# Patient Record
Sex: Female | Born: 1990 | Race: White | Hispanic: Yes | Marital: Single | Smoking: Current every day smoker
Health system: Southern US, Community
[De-identification: ages and names within clinical notes are randomized; demographics above are authoritative.]

## PROBLEM LIST (undated history)

## (undated) DIAGNOSIS — J189 Pneumonia, unspecified organism: Secondary | ICD-10-CM

## (undated) DIAGNOSIS — K219 Gastro-esophageal reflux disease without esophagitis: Secondary | ICD-10-CM

## (undated) DIAGNOSIS — F419 Anxiety disorder, unspecified: Secondary | ICD-10-CM

## (undated) DIAGNOSIS — R569 Unspecified convulsions: Secondary | ICD-10-CM

## (undated) HISTORY — PX: NO PAST SURGERIES: SHX2092

## (undated) HISTORY — DX: Unspecified convulsions: R56.9

---
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
## 2012-03-31 DIAGNOSIS — K219 Gastro-esophageal reflux disease without esophagitis: Secondary | ICD-10-CM

## 2012-03-31 HISTORY — DX: Gastro-esophageal reflux disease without esophagitis: K21.9

## 2012-08-04 ENCOUNTER — Ambulatory Visit: Payer: Self-pay | Admitting: Family Medicine

## 2012-08-04 LAB — URINALYSIS, COMPLETE
Bacteria: NONE SEEN
Bilirubin,UR: NEGATIVE
Blood: NEGATIVE
Glucose,UR: NEGATIVE mg/dL (ref 0–75)
Leukocyte Esterase: NEGATIVE
Nitrite: NEGATIVE
Ph: 7 (ref 4.5–8.0)
Protein: 30
RBC,UR: 1 /HPF (ref 0–5)
Specific Gravity: 1.017 (ref 1.003–1.030)
Squamous Epithelial: 1
WBC UR: 3 /HPF (ref 0–5)

## 2012-08-04 LAB — COMPREHENSIVE METABOLIC PANEL
Albumin: 4 g/dL (ref 3.8–5.6)
Alkaline Phosphatase: 81 U/L — ABNORMAL LOW (ref 82–169)
Anion Gap: 10 (ref 7–16)
BUN: 7 mg/dL (ref 7–18)
Bilirubin,Total: 1.7 mg/dL — ABNORMAL HIGH (ref 0.2–1.0)
Calcium, Total: 9.2 mg/dL (ref 9.0–10.7)
Chloride: 100 mmol/L (ref 98–107)
Co2: 22 mmol/L (ref 21–32)
Creatinine: 0.61 mg/dL (ref 0.60–1.30)
EGFR (African American): >60
EGFR (Non-African Amer.): >60
Glucose: 93 mg/dL (ref 65–99)
Osmolality: 262 (ref 275–301)
Potassium: 3.5 mmol/L (ref 3.5–5.1)
SGOT(AST): 14 U/L (ref 0–26)
SGPT (ALT): 15 U/L (ref 12–78)
Sodium: 132 mmol/L — ABNORMAL LOW (ref 136–145)
Total Protein: 8.2 g/dL (ref 6.4–8.6)

## 2012-08-04 LAB — CBC WITH DIFFERENTIAL/PLATELET
Basophil #: 0.1 10*3/uL (ref 0.0–0.1)
Basophil %: 0.3 %
Eosinophil #: 0 10*3/uL (ref 0.0–0.7)
Eosinophil %: 0 %
HCT: 40.4 % (ref 35.0–47.0)
HGB: 14 g/dL (ref 12.0–16.0)
Lymphocyte #: 0.9 10*3/uL — ABNORMAL LOW (ref 1.0–3.6)
Lymphocyte %: 3.7 %
MCH: 30 pg (ref 26.0–34.0)
MCHC: 34.6 g/dL (ref 32.0–36.0)
MCV: 87 fL (ref 80–100)
Monocyte #: 1.7 x10 3/mm — ABNORMAL HIGH (ref 0.2–0.9)
Monocyte %: 6.9 %
Neutrophil #: 21.4 10*3/uL — ABNORMAL HIGH (ref 1.4–6.5)
Neutrophil %: 89.1 %
Platelet: 246 10*3/uL (ref 150–440)
RBC: 4.67 10*6/uL (ref 3.80–5.20)
RDW: 12.9 % (ref 11.5–14.5)
WBC: 24.1 10*3/uL — ABNORMAL HIGH (ref 3.6–11.0)

## 2012-08-04 LAB — MONONUCLEOSIS SCREEN: Mono Test: NEGATIVE

## 2012-08-04 LAB — CBC
HCT: 42.5 % (ref 35.0–47.0)
HGB: 15 g/dL (ref 12.0–16.0)
MCH: 30.2 pg (ref 26.0–34.0)
MCHC: 35.3 g/dL (ref 32.0–36.0)
MCV: 86 fL (ref 80–100)
Platelet: 237 10*3/uL (ref 150–440)
RBC: 4.96 10*6/uL (ref 3.80–5.20)
RDW: 12.8 % (ref 11.5–14.5)
WBC: 26.7 10*3/uL — ABNORMAL HIGH (ref 3.6–11.0)

## 2012-08-04 LAB — RAPID INFLUENZA A&B ANTIGENS

## 2012-08-04 LAB — PREGNANCY, URINE: Pregnancy Test, Urine: NEGATIVE m[IU]/mL

## 2012-08-04 LAB — RAPID STREP-A WITH REFLX: Micro Text Report: NEGATIVE

## 2012-08-05 ENCOUNTER — Observation Stay: Payer: Self-pay | Admitting: Specialist

## 2012-08-05 LAB — CBC WITH DIFFERENTIAL/PLATELET
Basophil #: 0.1 10*3/uL (ref 0.0–0.1)
Basophil %: 0.4 %
Eosinophil #: 0 10*3/uL (ref 0.0–0.7)
Eosinophil %: 0 %
HCT: 35.4 % (ref 35.0–47.0)
HGB: 12.7 g/dL (ref 12.0–16.0)
Lymphocyte #: 1.2 10*3/uL (ref 1.0–3.6)
Lymphocyte %: 5.8 %
MCH: 30.7 pg (ref 26.0–34.0)
MCHC: 35.8 g/dL (ref 32.0–36.0)
MCV: 86 fL (ref 80–100)
Monocyte #: 1.8 x10 3/mm — ABNORMAL HIGH (ref 0.2–0.9)
Monocyte %: 8.5 %
Neutrophil #: 17.7 10*3/uL — ABNORMAL HIGH (ref 1.4–6.5)
Neutrophil %: 85.3 %
Platelet: 194 10*3/uL (ref 150–440)
RBC: 4.12 10*6/uL (ref 3.80–5.20)
RDW: 13 % (ref 11.5–14.5)
WBC: 20.7 10*3/uL — ABNORMAL HIGH (ref 3.6–11.0)

## 2012-08-05 LAB — BASIC METABOLIC PANEL
Anion Gap: 9 (ref 7–16)
BUN: 4 mg/dL — ABNORMAL LOW (ref 7–18)
Calcium, Total: 8.2 mg/dL — ABNORMAL LOW (ref 9.0–10.7)
Chloride: 108 mmol/L — ABNORMAL HIGH (ref 98–107)
Co2: 22 mmol/L (ref 21–32)
Creatinine: 0.65 mg/dL (ref 0.60–1.30)
EGFR (African American): >60
EGFR (Non-African Amer.): >60
Glucose: 114 mg/dL — ABNORMAL HIGH (ref 65–99)
Osmolality: 275 (ref 275–301)
Potassium: 3.4 mmol/L — ABNORMAL LOW (ref 3.5–5.1)
Sodium: 139 mmol/L (ref 136–145)

## 2012-08-05 LAB — SEDIMENTATION RATE: Erythrocyte Sed Rate: 14 mm/hr (ref 0–20)

## 2012-08-06 LAB — WBC: WBC: 11 10*3/uL (ref 3.6–11.0)

## 2012-08-07 LAB — BETA STREP CULTURE(ARMC)

## 2012-08-10 LAB — CULTURE, BLOOD (SINGLE)

## 2012-08-12 ENCOUNTER — Emergency Department: Payer: Self-pay | Admitting: Unknown Physician Specialty

## 2012-08-19 ENCOUNTER — Emergency Department: Payer: Self-pay | Admitting: Emergency Medicine

## 2012-08-25 ENCOUNTER — Emergency Department: Payer: Self-pay | Admitting: Internal Medicine

## 2012-12-31 ENCOUNTER — Ambulatory Visit: Payer: Self-pay | Admitting: Family Medicine

## 2012-12-31 LAB — URINALYSIS, COMPLETE

## 2013-01-02 LAB — URINE CULTURE

## 2013-01-18 ENCOUNTER — Ambulatory Visit: Payer: Self-pay | Admitting: Emergency Medicine

## 2013-01-18 LAB — RAPID STREP-A WITH REFLX: Micro Text Report: NEGATIVE

## 2013-01-21 LAB — BETA STREP CULTURE(ARMC)

## 2013-04-26 ENCOUNTER — Ambulatory Visit: Payer: Self-pay | Admitting: Emergency Medicine

## 2013-04-26 LAB — RAPID STREP-A WITH REFLX: Micro Text Report: NEGATIVE

## 2013-04-29 LAB — BETA STREP CULTURE(ARMC)

## 2013-05-12 ENCOUNTER — Ambulatory Visit: Payer: Self-pay

## 2013-12-12 ENCOUNTER — Ambulatory Visit: Payer: Self-pay

## 2013-12-12 ENCOUNTER — Ambulatory Visit: Payer: Self-pay | Admitting: Physician Assistant

## 2013-12-12 LAB — URINALYSIS, COMPLETE
Bacteria: NEGATIVE
Bilirubin,UR: NEGATIVE
Blood: NEGATIVE
Glucose,UR: NEGATIVE
Ketone: NEGATIVE
Leukocyte Esterase: NEGATIVE
Nitrite: NEGATIVE
Ph: 6 (ref 5.0–8.0)
Protein: NEGATIVE
Specific Gravity: 1.025 (ref 1.000–1.030)

## 2013-12-12 LAB — COMPREHENSIVE METABOLIC PANEL
Albumin: 3.9 g/dL (ref 3.4–5.0)
Alkaline Phosphatase: 54 U/L
Anion Gap: 8 (ref 7–16)
BUN: 12 mg/dL (ref 7–18)
Bilirubin,Total: 0.8 mg/dL (ref 0.2–1.0)
Calcium, Total: 9.1 mg/dL (ref 8.5–10.1)
Chloride: 103 mmol/L (ref 98–107)
Co2: 27 mmol/L (ref 21–32)
Creatinine: 0.7 mg/dL (ref 0.60–1.30)
EGFR (African American): >60
EGFR (Non-African Amer.): >60
Glucose: 85 mg/dL (ref 65–99)
Osmolality: 275 (ref 275–301)
Potassium: 4.3 mmol/L (ref 3.5–5.1)
SGOT(AST): 14 U/L — ABNORMAL LOW (ref 15–37)
SGPT (ALT): 24 U/L
Sodium: 138 mmol/L (ref 136–145)
Total Protein: 7.1 g/dL (ref 6.4–8.2)

## 2013-12-12 LAB — CBC WITH DIFFERENTIAL/PLATELET
Basophil #: 0 10*3/uL (ref 0.0–0.1)
Basophil %: 0.4 %
Eosinophil #: 0.1 10*3/uL (ref 0.0–0.7)
Eosinophil %: 1.5 %
HCT: 45.1 % (ref 35.0–47.0)
HGB: 15.1 g/dL (ref 12.0–16.0)
Lymphocyte #: 1.7 10*3/uL (ref 1.0–3.6)
Lymphocyte %: 19.6 %
MCH: 30.6 pg (ref 26.0–34.0)
MCHC: 33.4 g/dL (ref 32.0–36.0)
MCV: 92 fL (ref 80–100)
Monocyte #: 0.4 x10 3/mm (ref 0.2–0.9)
Monocyte %: 5 %
Neutrophil #: 6.4 10*3/uL (ref 1.4–6.5)
Neutrophil %: 73.5 %
Platelet: 257 10*3/uL (ref 150–440)
RBC: 4.91 10*6/uL (ref 3.80–5.20)
RDW: 13.3 % (ref 11.5–14.5)
WBC: 8.7 10*3/uL (ref 3.6–11.0)

## 2013-12-12 LAB — AMYLASE: Amylase: 26 U/L (ref 25–115)

## 2013-12-12 LAB — PREGNANCY, URINE: Pregnancy Test, Urine: NEGATIVE m[IU]/mL

## 2013-12-12 LAB — LIPASE, BLOOD: Lipase: 99 U/L (ref 73–393)

## 2013-12-14 LAB — URINE CULTURE

## 2014-07-21 NOTE — H&P (Signed)
PATIENT NAME:  Robin Hayden, ANSELMO MR#:  [id number] DATE OF BIRTH:  [birth year]  DATE OF ADMISSION:  08/05/2012  PRIMARY CARE PHYSICIAN:  Nonlocal.   REFERRING PHYSICIAN:  Dr. Owens Shark.   CHIEF COMPLAINT:  Sore throat. There is generalized body aches and fever for 2 days.   HISTORY OF PRESENT ILLNESS:  The patient is a 24 year old Caucasian female with a past medical history of gastritis is presenting to the ER with a chief complaint of two day history of sore throat associated with difficulty in swallowing, fever and body aches.  She is having chills associated with fever.  No sick contacts.  Denies any coughing.  As her situation is getting worse she came into the ER.  In the ER rapid strep test was negative.  Mono spot test was negative, but the patient is febrile, tachycardic and hypotensive with a 26.7 white count.  The patient's lactic acid is also elevated at 1.9.  Influenza test was negative.  The patient was given IV fluids, IV Rocephin and vancomycin and hospitalist team is called to admit the patient as she is septic and sick-looking.  The patient is reporting that she is not truly allergic to PENICILLIN, THOUGH PENICILLIN is mentioned in her allergy list.  The patient's sister who is at bedside and I have discussed with patient's mother over phone during my examination.  The patient denies any dizziness, chest pain, shortness of breath, coughing or nausea or vomiting.  No similar complaints in the past.   PAST MEDICAL HISTORY:  Gastritis.   PAST SURGICAL HISTORY:  None.     ALLERGIES:  According to the list here it says the patient is allergic to [place], but she is reporting that she is not truly allergic to PENICILLIN.   PSYCHOSOCIAL HISTORY:  Lives at home with mom.  Smokes 1 pack in a week.  Occasional intake of beer in 2 to 3 weeks.  Denies any street drugs.   FAMILY HISTORY:  Mother had history of hypertension.  Grandmother has history of diabetes mellitus.   HOME  MEDICATIONS:  Tylenol 500 mg once a day.   REVIEW OF SYSTEMS:  CONSTITUTIONAL:  Complaining of fever, fatigue, weakness and throat pain.  Denies any weight loss or weight gain.  EYES:  No blurry vision, inflammation, glaucoma.  EARS, NOSE, THROAT:  Denies any tinnitus, ear pain, snoring or sinus pain.  Complaining of difficulty in swallowing.  The patient has redness in her oropharynx.  RESPIRATIONS:  No cough, wheezing, painful respirations.  CARDIOVASCULAR:  No chest pain, palpitations, syncope.  GASTROINTESTINAL:  No nausea, vomiting, diarrhea.  Has history of gastritis.  GENITOURINARY:  No dysuria or hematuria.  GYNECOLOGIC AND BREAST:  No breast mass or vaginal discharge.  ENDOCRINE:  No polyuria, nocturia, thyroid problems.  HEMATOLOGIC AND LYMPHATIC:  No anemia, easy bruising or bleeding.  INTEGUMENTARY:  No acne, rash, lesions.  MUSCULOSKELETAL:  No joint pain in the neck, back, shoulder.  Denies any gout.  NEUROLOGIC:  No vertigo, ataxia, stroke or TIA.  PSYCHIATRIC:  No history of ADD or OCD or bipolar disorder.   PHYSICAL EXAMINATION: VITAL SIGNS:  Temperature initially 101.5, pulse 108, respirations 19, blood pressure 123/65, pulse ox 100%.  When the patient came in her blood pressure was at 99/69.  After fluid challenge her blood pressure went up to 131/73.   GENERAL APPEARANCE:  Not under acute distress, but seem to be uncomfortable and sick-looking, moderately built and moderately nourished.  HEENT:  Normocephalic, atraumatic.  Pupils are equally reacting to light and accommodation.  No scleral icterus.  No conjunctival injection.  Extraocular movements are intact.  No sinus tenderness.  Oropharynx, beefy red pharyngeal erythema is noted on both sides with tonsillar exudates.  No drooling is noted and the voice is normal.  Uvula is midline.   NECK:  Supple.  No JVD.  No thyromegaly.  Tender in the throat area, 1 to 2 palpable lymph nodes are present which are not fixed.  LUNGS:   Clear to auscultation bilaterally.  No accessory muscle usage.  No anterior chest wall tenderness on palpation.  CARDIAC:  S1, S2 normal.  Regular rate and rhythm, tachycardic.  No murmurs.  No gallops.  No edema.  GASTROINTESTINAL:  Soft.  Bowel sounds are positive in all four quadrants.  Nontender, nondistended.  No masses felt.  No hepatosplenomegaly.  NEUROLOGIC:  Awake, alert, oriented x 3.  Motor and sensory are grossly intact.  Cranial nerves II through XII are intact.  Reflexes are 2+.  EXTREMITIES:  No edema.  No cyanosis.  No clubbing.  SKIN:  Normal turgor.  Warm to touch.  No rashes.  No lesions.  MUSCULOSKELETAL:  No joint effusion, tenderness or erythema.   LABORATORY AND IMAGING STUDIES:  Rapid strep test is negative as reported by ER physician Dr. Owens Shark, beta strep culture is sent to the lab.  Urine pregnancy test is negative.  Influenza nasal swab test is negative for A and B.  Urinalysis, yellow in color, clear in appearance.  Glucose, bilirubin are negative.  Ketones are trace, specific gravity 1.010, leukocyte esterase negative, nitrate negative.  Lactic acid is 1.9, which is elevated.  WBC 26.7, hemoglobin 15.0, hematocrit 42.5, platelet count is 237.  LFTs, bilirubin total is elevated at 1.7, alkaline phosphatase 81, AST 14, ALT 15, total protein is 8.2, albumin 4.0, glucose 93, BUN 7, creatinine 0.61, sodium 132, potassium 3.5, chloride is 100, CO2 22, GFR greater than 60, anion gap 10, serum osmolality 262, serum calcium is 9.2.   ASSESSMENT AND PLAN:  A 24 year old Caucasian female presenting to the ER with a two day history of throat pain, fever associated with dysphagia will be admitted with the following assessment and plan:  1.  Sepsis from acute pharyngitis.  We will closely monitor for retropharyngeal abscess development.  Throat culture and sensitivities ordered in the ER which is pending.  We will provide her IV fluids as patient is tachycardic and Toradol for pain and  fever.  The patient will be given IV Rocephin and Zithromax for double coverage of acute pharyngitis including streptococcal pharyngitis.  ENT consult will be placed.  We will closely watch the patient for developing retropharyngeal abscess.  2.  Dysphagia, most probably from acute pharyngitis.  We will provide her IV Toradol for pain management.  3.  History of gastritis.  We will provide her PPI.  4.  Nicotine dependence.  We will provide her nicotine patch and counseling was provided.   5.  We will check ESR and a.m. labs including CBC and BMP.  6.  She is FULL CODE.   Total time spent on admission is 50 minutes.   The diagnosis and plan of care was discussed in detail with the patient and her sister at bedside.  Also, with the patient's mom over the phone.  ER nurse is present during the entire encounter.     ____________________________ Nicholes Mango, MD ag:ea D: 08/05/2012 00:49:17 ET T: 08/05/2012 02:03:43  ET JOB#: [id number]  cc: Nicholes Mango, MD, <Dictator> Nicholes Mango MD ELECTRONICALLY SIGNED 08/09/2012 0:54

## 2014-07-21 NOTE — Consult Note (Signed)
PATIENT NAME:  Robin Hayden, Robin Hayden MR#:  [id number] DATE OF BIRTH:  September 08, 1990  DATE OF CONSULTATION:  08/05/2012  REFERRING PHYSICIAN:  Dr. Amado CoeGouru  CONSULTING PHYSICIAN:  Zackery BarefootJ. Madison Adaliah Hiegel, MD  HISTORY OF PRESENT ILLNESS: The patient is a 24 year old white female who was admitted with severe tonsillitis and she is improved today. Notably she, has had a lot of strep infections, throat infections that were preceded by severe course of mononucleosis a few years ago. She is feeling like she is swallowing better and breathing better today after the IV antibiotics and IV steroids.   ALLERGIES:  LEVAQUIN AND PENICILLIN (SOME QUESTION AS TO TRUE ALLERGY TO PENICILLIN).   PAST MEDICAL HISTORY:  Gastritis.   PAST SURGICAL HISTORY: Negative.   SOCIAL HISTORY: The [facility] [employer]. Occasionally smokes.   PHYSICAL EXAMINATION: GENERAL: Nontoxic-appearing white female in no acute distress.  NECK: The trachea is midline. Larynx is midline. Oral cavity and oropharynx: There is significant tonsillar hypertrophy with exudative debris primarily on the left tonsil.    IMPRESSION: Acute tonsillitis. Hayden have recommended the patient continue IV antibiotics and be discharged on an oral antibiotic regimen. Certainly, the IV antibiotic can be converted to oral  antibiotics tomorrow and  she can be discharge. Hayden have discussed tonsillectomy with the patient's mother and the patient herself. We will get that scheduled in the next 1 to 2 weeks.    ____________________________ Shela CommonsJ. Gertie BaronMadison Maimuna Leaman, MD jmc:cc D: 08/05/2012 20:01:37 ET T: 08/05/2012 20:34:35 ET JOB#: [id number]  cc: Zackery BarefootJ. Madison Ender Rorke, MD, <Dictator> Glorious [facility]Sony[facility] Wendee CoppJMADISON Yesmin Mutch MD ELECTRONICALLY SIGNED 08/17/2012 6:23

## 2014-07-21 NOTE — Discharge Summary (Signed)
PATIENT NAME:  Robin Hayden, [name] MR#:  [id number] DATE OF BIRTH:  1990-08-23  DATE OF ADMISSION:  08/05/2012 DATE OF DISCHARGE:  08/06/2012  For a detailed note, please take a look at the history and physical done on admission by Dr. Amado CoeGouru.   DIAGNOSES AT DISCHARGE:  Is as follows:  Acute tonsillitis/pharyngitis.  Dysphagia secondary to the tonsillitis.  Leukocytosis secondary to the pharyngitis and tonsillitis.   DIET:  The patient is being discharged on a mechanical soft diet.   ACTIVITY:  As tolerated.   FOLLOW-UP:  With Dr. Gertie BaronMadison Clark from ENT in the next 1 to 2 weeks.   DISCHARGE MEDICATIONS:  Ranitidine 150 mg daily as needed.  Augmentin 500 mg twice daily x 12 days and Tylenol with hydrocodone 5/325 1 tab q. 6 hours as needed for pain.   CONSULTANTS DURING THE HOSPITAL COURSE:  Dr. Gertie BaronMadison Clark from ENT.   PERTINENT STUDIES DONE DURING THE HOSPITAL COURSE:  Are as follows:  A chest x-ray done on admission showing no acute cardiopulmonary disease.  A rapid strep done on admission which was negative.  Blood culture is negative.   ASSESSMENT AND PLAN:  This is a 24 year old female with medical problems as mentioned above, presented to the hospital with a two day history of throat pain, fever and dysphagia. 1.  Acute tonsillitis/pharyngitis.  This was likely the cause of patient's throat pain, fever and tachycardia and dysphagia.  The patient was started on IV antibiotics with Rocephin and Zithromax.  The patient's white cell count on admission was 24,000.  ENT consult was obtained to evaluate her for a possible peritonsillar abscess.  The patient after getting two days of IV antibiotics has clinically improved significantly.  Her white cell count has now normalized.  An ENT consult was obtained and after their evaluation they did not think that the patient had a peritonsillar abscess, but just likely a case of acute tonsillitis and pharyngitis.  She likely needs by mouth antibiotics and  likely needs her tonsils taken out in the next 1 to 2 weeks.  The patient will follow up with ENT as an outpatient upon discharge.  2.  Leukocytosis.  This was likely secondary to the tonsillitis and pharyngitis.  Again, the patient's white cell count has significantly improved with IV antibiotics.  3.  Dysphagia.  This was likely secondary to the pharyngitis and tonsillitis.  The patient has been on some Cepacol lozenges and her dysphagia has improved.  She has been able to tolerate a regular diet fairly well here in the hospital.    The patient is being discharged home.   TIME SPENT WITH THE DISCHARGE:  40 minutes.     ____________________________ Rolly PancakeVivek J. Cherlynn KaiserSainani, MD vjs:ea D: 08/06/2012 13:48:51 ET T: 08/07/2012 05:48:48 ET JOB#: [id number]  cc: Rolly PancakeVivek J. Cherlynn KaiserSainani, MD, <Dictator> Zackery BarefootJ. Madison Clark, MD Houston SirenVIVEK J Chriss Redel MD ELECTRONICALLY SIGNED 08/17/2012 19:55

## 2014-07-23 ENCOUNTER — Ambulatory Visit
Admit: 2014-07-23 | Disposition: A | Discharge status: Home / Self Care | Payer: Self-pay | Attending: Family Medicine | Admitting: Family Medicine

## 2014-07-29 ENCOUNTER — Emergency Department
Admit: 2014-07-29 | Disposition: A | Discharge status: Home / Self Care | Payer: Self-pay | Admitting: Emergency Medicine

## 2014-07-30 ENCOUNTER — Ambulatory Visit: Payer: Federal, State, Local not specified - PPO

## 2014-07-30 ENCOUNTER — Ambulatory Visit
Admission: EM | Admit: 2014-07-30 | Discharge: 2014-07-30 | Disposition: A | Discharge status: Home / Self Care | Payer: Federal, State, Local not specified - PPO | Attending: Internal Medicine | Admitting: Internal Medicine

## 2014-07-30 DIAGNOSIS — Y92411 Interstate highway as the place of occurrence of the external cause: Secondary | ICD-10-CM | POA: Insufficient documentation

## 2014-07-30 DIAGNOSIS — Y9389 Activity, other specified: Secondary | ICD-10-CM | POA: Insufficient documentation

## 2014-07-30 DIAGNOSIS — S233XXA Sprain of ligaments of thoracic spine, initial encounter: Secondary | ICD-10-CM

## 2014-07-30 DIAGNOSIS — Y999 Unspecified external cause status: Secondary | ICD-10-CM | POA: Insufficient documentation

## 2014-07-30 DIAGNOSIS — Z8701 Personal history of pneumonia (recurrent): Secondary | ICD-10-CM | POA: Diagnosis not present

## 2014-07-30 DIAGNOSIS — M549 Dorsalgia, unspecified: Secondary | ICD-10-CM | POA: Diagnosis present

## 2014-07-30 DIAGNOSIS — Z833 Family history of diabetes mellitus: Secondary | ICD-10-CM | POA: Diagnosis not present

## 2014-07-30 DIAGNOSIS — Z8249 Family history of ischemic heart disease and other diseases of the circulatory system: Secondary | ICD-10-CM | POA: Insufficient documentation

## 2014-07-30 DIAGNOSIS — K219 Gastro-esophageal reflux disease without esophagitis: Secondary | ICD-10-CM | POA: Insufficient documentation

## 2014-07-30 DIAGNOSIS — S161XXA Strain of muscle, fascia and tendon at neck level, initial encounter: Secondary | ICD-10-CM | POA: Diagnosis not present

## 2014-07-30 DIAGNOSIS — Z79899 Other long term (current) drug therapy: Secondary | ICD-10-CM | POA: Insufficient documentation

## 2014-07-30 DIAGNOSIS — S29012A Strain of muscle and tendon of back wall of thorax, initial encounter: Secondary | ICD-10-CM

## 2014-07-30 HISTORY — DX: Pneumonia, unspecified organism: J18.9

## 2014-07-30 HISTORY — DX: Gastro-esophageal reflux disease without esophagitis: K21.9

## 2014-07-30 MED ORDER — TRAMADOL HCL 50 MG PO TABS
50.0000 mg | ORAL_TABLET | Freq: Four times a day (QID) | ORAL | Status: DC | PRN
Start: 2014-07-30 — End: 2016-05-22

## 2014-07-30 MED ORDER — CYCLOBENZAPRINE HCL 5 MG PO TABS: 5.0000 mg | ORAL_TABLET | Freq: Three times a day (TID) | ORAL | Status: DC | PRN

## 2014-07-30 MED ORDER — DICLOFENAC 18 MG PO CAPS: 18.0000 mg | ORAL_CAPSULE | Freq: Three times a day (TID) | ORAL | Status: DC
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
## 2014-07-30 NOTE — Discharge Instructions (Signed)
Cervical Sprain A cervical sprain is an injury in the neck in which the strong, fibrous tissues (ligaments) that connect your neck bones stretch or tear. Cervical sprains can range from mild to severe. Severe cervical sprains can cause the neck vertebrae to be unstable. This can lead to damage of the spinal cord and can result in serious nervous system problems. The amount of time it takes for a cervical sprain to get better depends on the cause and extent of the injury. Most cervical sprains heal in 1 to 3 weeks. CAUSES  Severe cervical sprains may be caused by:   Contact sport injuries (such as from football, rugby, wrestling, hockey, auto racing, gymnastics, diving, martial arts, or boxing).   Motor vehicle collisions.   Whiplash injuries. This is an injury from a sudden forward and backward whipping movement of the head and neck.  Falls.  Mild cervical sprains may be caused by:   Being in an awkward position, such as while cradling a telephone between your ear and shoulder.   Sitting in a chair that does not offer proper support.   Working at a poorly designed computer station.   Looking up or down for long periods of time.  SYMPTOMS   Pain, soreness, stiffness, or a burning sensation in the front, back, or sides of the neck. This discomfort may develop immediately after the injury or slowly, 24 hours or more after the injury.   Pain or tenderness directly in the middle of the back of the neck.   Shoulder or upper back pain.   Limited ability to move the neck.   Headache.   Dizziness.   Weakness, numbness, or tingling in the hands or arms.   Muscle spasms.   Difficulty swallowing or chewing.   Tenderness and swelling of the neck.  DIAGNOSIS  Most of the time your health care provider can diagnose a cervical sprain by taking your history and doing a physical exam. Your health care provider will ask about previous neck injuries and any known neck  problems, such as arthritis in the neck. X-rays may be taken to find out if there are any other problems, such as with the bones of the neck. Other tests, such as a CT scan or MRI, may also be needed.  TREATMENT  Treatment depends on the severity of the cervical sprain. Mild sprains can be treated with rest, keeping the neck in place (immobilization), and pain medicines. Severe cervical sprains are immediately immobilized. Further treatment is done to help with pain, muscle spasms, and other symptoms and may include:  Medicines, such as pain relievers, numbing medicines, or muscle relaxants.   Physical therapy. This may involve stretching exercises, strengthening exercises, and posture training. Exercises and improved posture can help stabilize the neck, strengthen muscles, and help stop symptoms from returning.  HOME CARE INSTRUCTIONS   Put ice on the injured area.   Put ice in a plastic bag.   Place a towel between your skin and the bag.   Leave the ice on for 15-20 minutes, 3-4 times a day.   If your injury was severe, you may have been given a cervical collar to wear. A cervical collar is a two-piece collar designed to keep your neck from moving while it heals.  Do not remove the collar unless instructed by your health care provider.  If you have long hair, keep it outside of the collar.  Ask your health care provider before making any adjustments to your collar. Minor   adjustments may be required over time to improve comfort and reduce pressure on your chin or on the back of your head.  Ifyou are allowed to remove the collar for cleaning or bathing, follow your health care provider's instructions on how to do so safely.  Keep your collar clean by wiping it with mild soap and water and drying it completely. If the collar you have been given includes removable pads, remove them every 1-2 days and hand wash them with soap and water. Allow them to air dry. They should be completely  dry before you wear them in the collar.  If you are allowed to remove the collar for cleaning and bathing, wash and dry the skin of your neck. Check your skin for irritation or sores. If you see any, tell your health care provider.  Do not drive while wearing the collar.   Only take over-the-counter or prescription medicines for pain, discomfort, or fever as directed by your health care provider.   Keep all follow-up appointments as directed by your health care provider.   Keep all physical therapy appointments as directed by your health care provider.   Make any needed adjustments to your workstation to promote good posture.   Avoid positions and activities that make your symptoms worse.   Warm up and stretch before being active to help prevent problems.  SEEK MEDICAL CARE IF:   Your pain is not controlled with medicine.   You are unable to decrease your pain medicine over time as planned.   Your activity level is not improving as expected.  SEEK IMMEDIATE MEDICAL CARE IF:   You develop any bleeding.  You develop stomach upset.  You have signs of an allergic reaction to your medicine.   Your symptoms get worse.   You develop new, unexplained symptoms.   You have numbness, tingling, weakness, or paralysis in any part of your body.  MAKE SURE YOU:   Understand these instructions.  Will watch your condition.  Will get help right away if you are not doing well or get worse. Document Released: 01/12/2007 Document Revised: 03/22/2013 Document Reviewed: 09/22/2012 ExitCare Patient Information 2015 [employer]. This information is not intended to replace advice given to you by your health care provider. Make sure you discuss any questions you have with your health care provider.   Thoracic Strain You have injured the muscles or tendons that attach to the upper part of your back behind your chest. This injury is called a thoracic strain, thoracic sprain, or  mid-back strain.  CAUSES  The cause of thoracic strain varies. A less severe injury involves pulling a muscle or tendon without tearing it. A more severe injury involves tearing (rupturing) a muscle or tendon. With less severe injuries, there may be little loss of strength. Sometimes, there are breaks (fractures) in the bones to which the muscles are attached. These fractures are rare, unless there was a direct hit (trauma) or you have weak bones due to osteoporosis or age. Longstanding strains may be caused by overuse or improper form during certain movements. Obesity can also increase your risk for back injuries. Sudden strains may occur due to injury or not warming up properly before exercise. Often, there is no obvious cause for a thoracic strain. SYMPTOMS  The main symptom is pain, especially with movement, such as during exercise. DIAGNOSIS  Your caregiver can usually tell what is wrong by taking an X-ray and doing a physical exam. TREATMENT   Physical therapy may   be helpful for recovery. Your caregiver can give you exercises to do or refer you to a physical therapist after your pain improves.  After your pain improves, strengthening and conditioning programs appropriate for your sport or occupation may be helpful.  Always warm up before physical activities or athletics. Stretching after physical activity may also help.  Certain over-the-counter medicines may also help. Ask your caregiver if there are medicines that would help you. If this is your first thoracic strain injury, proper care and proper healing time before starting activities should prevent long-term problems. Torn ligaments and tendons require as long to heal as broken bones. Average healing times may be only 1 week for a mild strain. For torn muscles and tendons, healing time may be up to 6 weeks to 2 months. HOME CARE INSTRUCTIONS   Apply ice to the injured area. Ice massages may also be used as directed.  Put ice in a  plastic bag.  Place a towel between your skin and the bag.  Leave the ice on for 15-20 minutes, 03-04 times a day, for the first 2 days.  Only take over-the-counter or prescription medicines for pain, discomfort, or fever as directed by your caregiver.  Keep your appointments for physical therapy if this was prescribed.  Use wraps and back braces as instructed. SEEK IMMEDIATE MEDICAL CARE IF:   You have an increase in bruising, swelling, or pain.  Your pain has not improved with medicines.  You develop new shortness of breath, chest pain, or fever.  Problems seem to be getting worse rather than better. MAKE SURE YOU:   Understand these instructions.  Will watch your condition.  Will get help right away if you are not doing well or get worse. Document Released: 06/07/2003 Document Revised: 06/09/2011 Document Reviewed: 05/03/2010 ExitCare Patient Information 2015 [employer]. This information is not intended to replace advice given to you by your health care provider. Make sure you discuss any questions you have with your health care provider.  
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
## 2014-07-31 LAB — PREGNANCY, URINE: Preg Test, Ur: NEGATIVE

## 2015-05-01 ENCOUNTER — Encounter: Payer: Self-pay | Admitting: Emergency Medicine

## 2015-05-01 ENCOUNTER — Ambulatory Visit
Admission: EM | Admit: 2015-05-01 | Discharge: 2015-05-01 | Disposition: A | Discharge status: Home / Self Care | Payer: BLUE CROSS/BLUE SHIELD | Attending: Family Medicine | Admitting: Family Medicine

## 2015-05-01 DIAGNOSIS — J069 Acute upper respiratory infection, unspecified: Secondary | ICD-10-CM

## 2015-05-01 DIAGNOSIS — H6123 Impacted cerumen, bilateral: Secondary | ICD-10-CM

## 2015-05-01 MED ORDER — FLUTICASONE PROPIONATE 50 MCG/ACT NA SUSP: 2.0000 | Freq: Every day | NASAL | Status: DC

## 2015-05-01 MED ORDER — HYDROCOD POLST-CPM POLST ER 10-8 MG/5ML PO SUER: 5.0000 mL | Freq: Two times a day (BID) | ORAL | Status: DC

## 2015-05-01 MED ORDER — ONDANSETRON 4 MG PO TBDP: 4.0000 mg | ORAL_TABLET | Freq: Three times a day (TID) | ORAL | Status: DC | PRN

## 2015-05-01 NOTE — ED Provider Notes (Addendum)
CSN: [id number]     Arrival date & time 05/01/15  1148 History   First MD Initiated Contact with Patient 05/01/15 1240     Chief Complaint  Patient presents with  . Headache  . Nasal Congestion  . Sore Throat   (Consider location/radiation/quality/duration/timing/severity/associated sxs/prior Treatment) HPI   25 year old female who presents with a week and half of nausea headache chills runny nose and a sore throat. States that she's been coughing at nighttime frequently. She has felt feverish but has never taken her temperature and is actually afebrile today. Despite her nausea and occasional vomiting she has been able to keep down some liquids. She had previously had pneumonia as a child in 1998  Past Medical History  Diagnosis Date  . GERD (gastroesophageal reflux disease) 2014  . PNA (pneumonia) 1998   History reviewed. No pertinent past surgical history. Family History  Problem Relation Age of Onset  . Diabetes Mother   . Hypertension Mother    Social History  Substance Use Topics  . Smoking status: Never Smoker   . Smokeless tobacco: None  . Alcohol Use: No   OB History    No data available     Review of Systems  Constitutional: Positive for activity change. Negative for fever, chills and fatigue.  HENT: Positive for congestion, postnasal drip, rhinorrhea, sinus pressure and sore throat.   Respiratory: Positive for cough.   Gastrointestinal: Positive for nausea and vomiting.  All other systems reviewed and are negative.   Allergies  Review of patient's allergies indicates no known allergies.  Home Medications   Prior to Admission medications   Medication Sig Start Date End Date Taking? Authorizing Provider  chlorpheniramine-HYDROcodone (TUSSIONEX PENNKINETIC ER) 10-8 MG/5ML SUER Take 5 mLs by mouth 2 (two) times daily. 05/01/15   Lutricia Feil, PA-C  cyclobenzaprine (FLEXERIL) 5 MG tablet Take 1 tablet (5 mg total) by mouth 3 (three) times daily as needed  for muscle spasms. Take 1-2 tablets (  max) by mouth 3 (three) times daily as needed for muscle spasms 07/30/14   Barbaraann Barthel, NP  Diclofenac 18 MG CAPS Take 18 mg by mouth 3 (three) times daily. 07/30/14   Barbaraann Barthel, NP  fluticasone (FLONASE) 50 MCG/ACT nasal spray Place 2 sprays into both nostrils daily. 05/01/15   Lutricia Feil, PA-C  ondansetron (ZOFRAN ODT) 4 MG disintegrating tablet Take 1 tablet (4 mg total) by mouth every 8 (eight) hours as needed for nausea or vomiting. 05/01/15   Lutricia Feil, PA-C  traMADol (ULTRAM) 50 MG tablet Take 1 tablet (50 mg total) by mouth every 6 (six) hours as needed. 07/30/14   Barbaraann Barthel, NP   Meds Ordered and Administered this Visit  Medications - No data to display  BP 124/70 mmHg  Pulse 79  Temp(Src) 98.9 F (37.2 C) (Oral)  Resp 20  Ht  (1.803 m)  Wt 220 lb (99.791 kg)  BMI 30.70 kg/m2  SpO2 98%  LMP 04/15/2015 (Exact Date) No data found.   Physical Exam  Constitutional: She is oriented to person, place, and time. She appears well-developed and well-nourished. No distress.  HENT:  Head: Normocephalic and atraumatic.  Both TMs are occluded with cerumen  Eyes: Conjunctivae are normal. Pupils are equal, round, and reactive to light.  Neck: Normal range of motion. Neck supple. Thyromegaly present.  Pulmonary/Chest: Effort normal and breath sounds normal. No respiratory distress. She has no wheezes. She has no rales.  Musculoskeletal: Normal  range of motion. She exhibits no edema or tenderness.  Lymphadenopathy:    She has no cervical adenopathy.  Neurological: She is alert and oriented to person, place, and time.  Skin: Skin is warm and dry. She is not diaphoretic.  Psychiatric: She has a normal mood and affect. Her behavior is normal. Judgment and thought content normal.  Vitals reviewed.   ED Course  Procedures (including critical care time)  Labs Review Labs Reviewed - No data to display  Imaging  Review No results found.   Visual Acuity Review  Right Eye Distance:   Left Eye Distance:   Bilateral Distance:    Right Eye Near:   Left Eye Near:    Bilateral Near:      Bilateral ear instrumentation and lavage was performed with complete removal of the impacted cerumen. Inspection afterwards showed the TM's to be normal no bulging or erythema. Patient tolerated procedure well.   MDM   1. Acute URI   2. Cerumen impaction, bilateral   Plan: 1.Diagnosis reviewed with patient 2. rx as per orders; risks, benefits, potential side effects reviewed with patient 3. Recommend supportive treatment with rest and fluid intake. I told this most likely a virus and does not require antibiotics at this time. Her symptomatically. She needs to find a PCP that she can  referred to in the future for any kinds of problems 4. F/u prn if symptoms worsen or don't improve   Discharge Medication List as of 05/01/2015  1:28 PM    START taking these medications   Details  chlorpheniramine-HYDROcodone (TUSSIONEX PENNKINETIC ER) 10-8 MG/5ML SUER Take 5 mLs by mouth 2 (two) times daily., Starting 05/01/2015, Until Discontinued, Print    fluticasone (FLONASE) 50 MCG/ACT nasal spray Place 2 sprays into both nostrils daily., Starting 05/01/2015, Until Discontinued, Normal    ondansetron (ZOFRAN ODT) 4 MG disintegrating tablet Take 1 tablet (4 mg total) by mouth every 8 (eight) hours as needed for nausea or vomiting., Starting 05/01/2015, Until Discontinued, Normal               Lutricia Feil, PA-C 05/01/15 2125

## 2015-05-01 NOTE — ED Notes (Signed)
Pt presents with headache, chills, runny nose and sore throat for about one week. Has tried otc meds with no help.

## 2015-05-01 NOTE — Discharge Instructions (Signed)
Cool Mist Vaporizers Vaporizers may help relieve the symptoms of a cough and cold. They add moisture to the air, which helps mucus to become thinner and less sticky. This makes it easier to breathe and cough up secretions. Cool mist vaporizers do not cause serious burns like hot mist vaporizers, which may also be called steamers or humidifiers. Vaporizers have not been proven to help with colds. You should not use a vaporizer if you are allergic to mold. HOME CARE INSTRUCTIONS  Follow the package instructions for the vaporizer.  Do not use anything other than distilled water in the vaporizer.  Do not run the vaporizer all of the time. This can cause mold or bacteria to grow in the vaporizer.  Clean the vaporizer after each time it is used.  Clean and dry the vaporizer well before storing it.  Stop using the vaporizer if worsening respiratory symptoms develop.   This information is not intended to replace advice given to you by your health care provider. Make sure you discuss any questions you have with your health care provider.   Document Released: 12/13/2003 Document Revised: 03/22/2013 Document Reviewed: 08/04/2012 Elsevier Interactive Patient Education 2016 [employer]  Cerumen Impaction The structures of the external ear canal secrete a waxy substance known as cerumen. Excess cerumen can build up in the ear canal, causing a condition known as cerumen impaction. Cerumen impaction can cause ear pain and disrupt the function of the ear. The rate of cerumen production differs for each individual. In certain individuals, the configuration of the ear canal may decrease his or her ability to naturally remove cerumen. CAUSES Cerumen impaction is caused by excessive cerumen production or buildup. RISK FACTORS  Frequent use of swabs to clean ears.  Having narrow ear canals.  Having eczema.  Being dehydrated. SIGNS AND SYMPTOMS  Diminished hearing.  Ear drainage.  Ear  pain.  Ear itch. TREATMENT Treatment may involve:  Over-the-counter or prescription ear drops to soften the cerumen.  Removal of cerumen by a health care provider. This may be done with:  Irrigation with warm water. This is the most common method of removal.  Ear curettes and other instruments.  Surgery. This may be done in severe cases. HOME CARE INSTRUCTIONS  Take medicines only as directed by your health care provider.  Do not insert objects into the ear with the intent of cleaning the ear. PREVENTION  Do not insert objects into the ear, even with the intent of cleaning the ear. Removing cerumen as a part of normal hygiene is not necessary, and the use of swabs in the ear canal is not recommended.  Drink enough water to keep your urine clear or pale yellow.  Control your eczema if you have it. SEEK MEDICAL CARE IF:  You develop ear pain.  You develop bleeding from the ear.  The cerumen does not clear after you use ear drops as directed.   This information is not intended to replace advice given to you by your health care provider. Make sure you discuss any questions you have with your health care provider.   Document Released: 04/24/2004 Document Revised: 04/07/2014 Document Reviewed: 11/01/2014 Elsevier Interactive Patient Education 2016 [employer]  Upper Respiratory Infection, Adult Most upper respiratory infections (URIs) are a viral infection of the air passages leading to the lungs. A URI affects the nose, throat, and upper air passages. The most common type of URI is nasopharyngitis and is typically referred to as "the common cold." URIs run their  course and usually go away on their own. Most of the time, a URI does not require medical attention, but sometimes a bacterial infection in the upper airways can follow a viral infection. This is called a secondary infection. Sinus and middle ear infections are common types of secondary upper respiratory  infections. Bacterial pneumonia can also complicate a URI. A URI can worsen asthma and chronic obstructive pulmonary disease (COPD). Sometimes, these complications can require emergency medical care and may be life threatening.  CAUSES Almost all URIs are caused by viruses. A virus is a type of germ and can spread from one person to another.  RISKS FACTORS You may be at risk for a URI if:   You smoke.   You have chronic heart or lung disease.  You have a weakened defense (immune) system.   You are very young or very old.   You have nasal allergies or asthma.  You work in crowded or poorly ventilated areas.  You work in health care facilities or schools. SIGNS AND SYMPTOMS  Symptoms typically develop 2-3 days after you come in contact with a cold virus. Most viral URIs last 7-10 days. However, viral URIs from the influenza virus (flu virus) can last 14-18 days and are typically more severe. Symptoms may include:   Runny or stuffy (congested) nose.   Sneezing.   Cough.   Sore throat.   Headache.   Fatigue.   Fever.   Loss of appetite.   Pain in your forehead, behind your eyes, and over your cheekbones (sinus pain).  Muscle aches.  DIAGNOSIS  Your health care provider may diagnose a URI by:  Physical exam.  Tests to check that your symptoms are not due to another condition such as:  Strep throat.  Sinusitis.  Pneumonia.  Asthma. TREATMENT  A URI goes away on its own with time. It cannot be cured with medicines, but medicines may be prescribed or recommended to relieve symptoms. Medicines may help:  Reduce your fever.  Reduce your cough.  Relieve nasal congestion. HOME CARE INSTRUCTIONS   Take medicines only as directed by your health care provider.   Gargle warm saltwater or take cough drops to comfort your throat as directed by your health care provider.  Use a warm mist humidifier or inhale steam from a shower to increase air moisture.  This may make it easier to breathe.  Drink enough fluid to keep your urine clear or pale yellow.   Eat soups and other clear broths and maintain good nutrition.   Rest as needed.   Return to work when your temperature has returned to normal or as your health care provider advises. You may need to stay home longer to avoid infecting others. You can also use a face mask and careful hand washing to prevent spread of the virus.  Increase the usage of your inhaler if you have asthma.   Do not use any tobacco products, including cigarettes, chewing tobacco, or electronic cigarettes. If you need help quitting, ask your health care provider. PREVENTION  The best way to protect yourself from getting a cold is to practice good hygiene.   Avoid oral or hand contact with people with cold symptoms.   Wash your hands often if contact occurs.  There is no clear evidence that vitamin C, vitamin E, echinacea, or exercise reduces the chance of developing a cold. However, it is always recommended to get plenty of rest, exercise, and practice good nutrition.  SEEK MEDICAL CARE  IF:   You are getting worse rather than better.   Your symptoms are not controlled by medicine.   You have chills.  You have worsening shortness of breath.  You have brown or red mucus.  You have yellow or brown nasal discharge.  You have pain in your face, especially when you bend forward.  You have a fever.  You have swollen neck glands.  You have pain while swallowing.  You have white areas in the back of your throat. SEEK IMMEDIATE MEDICAL CARE IF:   You have severe or persistent:  Headache.  Ear pain.  Sinus pain.  Chest pain.  You have chronic lung disease and any of the following:  Wheezing.  Prolonged cough.  Coughing up blood.  A change in your usual mucus.  You have a stiff neck.  You have changes in your:  Vision.  Hearing.  Thinking.  Mood. MAKE SURE YOU:    Understand these instructions.  Will watch your condition.  Will get help right away if you are not doing well or get worse.   This information is not intended to replace advice given to you by your health care provider. Make sure you discuss any questions you have with your health care provider.   Document Released: 09/10/2000 Document Revised: 08/01/2014 Document Reviewed: 06/22/2013 Elsevier Interactive Patient Education [employer]

## 2015-05-02 ENCOUNTER — Ambulatory Visit
Admission: EM | Admit: 2015-05-02 | Discharge: 2015-05-02 | Disposition: A | Discharge status: Home / Self Care | Payer: Federal, State, Local not specified - PPO | Attending: Family Medicine | Admitting: Family Medicine

## 2015-05-02 ENCOUNTER — Encounter: Payer: Self-pay | Admitting: Emergency Medicine

## 2015-05-02 DIAGNOSIS — B9789 Other viral agents as the cause of diseases classified elsewhere: Principal | ICD-10-CM

## 2015-05-02 DIAGNOSIS — J069 Acute upper respiratory infection, unspecified: Secondary | ICD-10-CM

## 2015-05-02 LAB — RAPID STREP SCREEN (MED CTR MEBANE ONLY): Streptococcus, Group A Screen (Direct): NEGATIVE

## 2015-05-02 NOTE — ED Provider Notes (Signed)
CSN: [id number]     Arrival date & time 05/02/15  1657 History   First MD Initiated Contact with Patient 05/02/15 1729     Chief Complaint  Patient presents with  . Sore Throat   (Consider location/radiation/quality/duration/timing/severity/associated sxs/prior Treatment) Patient is a 25 y.o. female presenting with URI. The history is provided by the patient.  URI Presenting symptoms: congestion, cough and rhinorrhea   Severity:  Moderate Onset quality:  Sudden Duration:  1 week Timing:  Constant Progression:  Worsening (patient seen here yesterday and diagnosed with viral URI; states today sore throat was worse) Chronicity:  New Ineffective treatments:  OTC medications Associated symptoms: no arthralgias, no neck pain and no wheezing   Risk factors: no diabetes mellitus     Past Medical History  Diagnosis Date  . GERD (gastroesophageal reflux disease) 2014  . PNA (pneumonia) 1998   History reviewed. No pertinent past surgical history. Family History  Problem Relation Age of Onset  . Diabetes Mother   . Hypertension Mother    Social History  Substance Use Topics  . Smoking status: Never Smoker   . Smokeless tobacco: None  . Alcohol Use: No   OB History    No data available     Review of Systems  HENT: Positive for congestion and rhinorrhea.   Respiratory: Positive for cough. Negative for wheezing.   Musculoskeletal: Negative for arthralgias and neck pain.    Allergies  Review of patient's allergies indicates no known allergies.  Home Medications   Prior to Admission medications   Medication Sig Start Date End Date Taking? Authorizing Provider  chlorpheniramine-HYDROcodone (TUSSIONEX PENNKINETIC ER) 10-8 MG/5ML SUER Take 5 mLs by mouth 2 (two) times daily. 05/01/15   Lutricia Feil, PA-C  cyclobenzaprine (FLEXERIL) 5 MG tablet Take 1 tablet (5 mg total) by mouth 3 (three) times daily as needed for muscle spasms. Take 1-2 tablets (  max) by mouth 3 (three)  times daily as needed for muscle spasms 07/30/14   Barbaraann Barthel, NP  Diclofenac 18 MG CAPS Take 18 mg by mouth 3 (three) times daily. 07/30/14   Barbaraann Barthel, NP  fluticasone (FLONASE) 50 MCG/ACT nasal spray Place 2 sprays into both nostrils daily. 05/01/15   Lutricia Feil, PA-C  ondansetron (ZOFRAN ODT) 4 MG disintegrating tablet Take 1 tablet (4 mg total) by mouth every 8 (eight) hours as needed for nausea or vomiting. 05/01/15   Lutricia Feil, PA-C  traMADol (ULTRAM) 50 MG tablet Take 1 tablet (50 mg total) by mouth every 6 (six) hours as needed. 07/30/14   Barbaraann Barthel, NP   Meds Ordered and Administered this Visit  Medications - No data to display  BP 117/84 mmHg  Pulse 96  Temp(Src) 98.7 F (37.1 C) (Oral)  Resp 18  Ht  (1.803 m)  Wt 220 lb (99.791 kg)  BMI 30.70 kg/m2  SpO2 100%  LMP 04/15/2015 (Exact Date) No data found.   Physical Exam  Constitutional: She appears well-developed and well-nourished. No distress.  HENT:  Head: Normocephalic and atraumatic.  Right Ear: Tympanic membrane, external ear and ear canal normal.  Left Ear: Tympanic membrane, external ear and ear canal normal.  Nose: Rhinorrhea present. No nose lacerations, sinus tenderness, nasal deformity, septal deviation or nasal septal hematoma. No epistaxis.  No foreign bodies.  Mouth/Throat: Uvula is midline and mucous membranes are normal. Posterior oropharyngeal erythema present. No oropharyngeal exudate, posterior oropharyngeal edema or tonsillar abscesses.  Eyes: Conjunctivae and  EOM are normal. Pupils are equal, round, and reactive to light. Right eye exhibits no discharge. Left eye exhibits no discharge. No scleral icterus.  Neck: Normal range of motion. Neck supple. No thyromegaly present.  Cardiovascular: Normal rate, regular rhythm and normal heart sounds.   Pulmonary/Chest: Effort normal and breath sounds normal. No respiratory distress. She has no wheezes. She has no rales.   Lymphadenopathy:    She has no cervical adenopathy.  Skin: She is not diaphoretic.  Nursing note and vitals reviewed.   ED Course  Procedures (including critical care time)  Labs Review Labs Reviewed  RAPID STREP SCREEN (NOT AT [facility])  CULTURE, GROUP A STREP [facility])    Imaging Review No results found.   Visual Acuity Review  Right Eye Distance:   Left Eye Distance:   Bilateral Distance:    Right Eye Near:   Left Eye Near:    Bilateral Near:         MDM   1. Viral URI with cough    1. Lab results (negative strep) and diagnosis reviewed with patient 2. Recommend supportive treatment with otc analgesics, increased fluids and medications prescribed yesterday for symptomatic treatment 3. Follow-up prn if symptoms worsen or don't improve    Payton Mccallum, MD 05/02/15 [phone]

## 2015-05-02 NOTE — ED Notes (Signed)
Pt presents with sore throat, worse than when she was here yesterday.

## 2015-05-04 LAB — CULTURE, GROUP A STREP (THRC)

## 2015-05-07 ENCOUNTER — Ambulatory Visit
Admission: EM | Admit: 2015-05-07 | Discharge: 2015-05-07 | Discharge status: Absent without leave | Payer: Federal, State, Local not specified - PPO

## 2015-06-01 ENCOUNTER — Encounter: Payer: Self-pay | Admitting: Emergency Medicine

## 2015-06-01 ENCOUNTER — Ambulatory Visit
Admission: EM | Admit: 2015-06-01 | Discharge: 2015-06-01 | Disposition: A | Discharge status: Home / Self Care | Payer: Federal, State, Local not specified - PPO | Attending: Family Medicine | Admitting: Family Medicine

## 2015-06-01 DIAGNOSIS — R059 Cough, unspecified: Secondary | ICD-10-CM

## 2015-06-01 DIAGNOSIS — R05 Cough: Secondary | ICD-10-CM

## 2015-06-01 NOTE — ED Notes (Signed)
Pt presents with cough, headaches and flu like symptoms going on for three weeks. Also reports some blood in her stool only when she goes to the bathroom.

## 2015-06-01 NOTE — ED Provider Notes (Addendum)
CSN: [id number]     Arrival date & time 06/01/15  1119 History   First MD Initiated Contact with Patient 06/01/15 1305     Chief Complaint  Patient presents with  . Cough  . Headache  . Generalized Body Aches   (Consider location/radiation/quality/duration/timing/severity/associated sxs/prior Treatment) HPI  Past Medical History  Diagnosis Date  . GERD (gastroesophageal reflux disease) 2014  . PNA (pneumonia) 1998   History reviewed. No pertinent past surgical history. Family History  Problem Relation Age of Onset  . Diabetes Mother   . Hypertension Mother    Social History  Substance Use Topics  . Smoking status: Never Smoker   . Smokeless tobacco: None  . Alcohol Use: No   OB History    No data available     Review of Systems  Allergies  Review of patient's allergies indicates no known allergies.  Home Medications   Prior to Admission medications   Medication Sig Start Date End Date Taking? Authorizing Provider  chlorpheniramine-HYDROcodone (TUSSIONEX PENNKINETIC ER) 10-8 MG/5ML SUER Take 5 mLs by mouth 2 (two) times daily. 05/01/15   Lutricia FeilWilliam P Roemer, PA-C  cyclobenzaprine (FLEXERIL) 5 MG tablet Take 1 tablet (5 mg total) by mouth 3 (three) times daily as needed for muscle spasms. Take 1-2 tablets (10mg  max) by mouth 3 (three) times daily as needed for muscle spasms 07/30/14   Barbaraann Barthelina A Betancourt, NP  Diclofenac 18 MG CAPS Take 18 mg by mouth 3 (three) times daily. 07/30/14   Barbaraann Barthelina A Betancourt, NP  fluticasone (FLONASE) 50 MCG/ACT nasal spray Place 2 sprays into both nostrils daily. 05/01/15   Lutricia FeilWilliam P Roemer, PA-C  ondansetron (ZOFRAN ODT) 4 MG disintegrating tablet Take 1 tablet (4 mg total) by mouth every 8 (eight) hours as needed for nausea or vomiting. 05/01/15   Lutricia FeilWilliam P Roemer, PA-C  traMADol (ULTRAM) 50 MG tablet Take 1 tablet (50 mg total) by mouth every 6 (six) hours as needed. 07/30/14   Barbaraann Barthelina A Betancourt, NP   Meds Ordered and Administered this Visit   Medications - No data to display  BP 114/81 mmHg  Pulse 71  Temp(Src) 97.9 F (36.6 C) (Oral)  Resp 20  Ht 5\' 11"  (1.803 m)  Wt 220 lb (99.791 kg)  BMI 30.70 kg/m2  SpO2 100%  LMP 05/18/2015 No data found.   Physical Exam  ED Course  Procedures (including critical care time)  Labs Review Labs Reviewed - No data to display  Imaging Review No results found.   Visual Acuity Review  Right Eye Distance:   Left Eye Distance:   Bilateral Distance:    Right Eye Near:   Left Eye Near:    Bilateral Near:         MDM   1. Cough     left without being seen after triage    Payton Mccallumrlando Deloras Reichard, MD 06/01/15 1311  Payton Mccallumrlando Brentley Landfair, MD 06/13/15 [phone]

## 2016-03-12 ENCOUNTER — Encounter: Payer: Self-pay | Admitting: *Deleted

## 2016-03-12 ENCOUNTER — Ambulatory Visit
Admission: EM | Admit: 2016-03-12 | Discharge: 2016-03-12 | Disposition: A | Discharge status: Home / Self Care | Payer: BLUE CROSS/BLUE SHIELD | Attending: Family Medicine | Admitting: Family Medicine

## 2016-03-12 DIAGNOSIS — H6691 Otitis media, unspecified, right ear: Secondary | ICD-10-CM | POA: Diagnosis not present

## 2016-03-12 DIAGNOSIS — H6123 Impacted cerumen, bilateral: Secondary | ICD-10-CM

## 2016-03-12 MED ORDER — AMOXICILLIN 875 MG PO TABS: 875.0000 mg | ORAL_TABLET | Freq: Two times a day (BID) | ORAL | 0 refills | Status: DC

## 2016-03-12 NOTE — Discharge Instructions (Signed)
Take medication as prescribed. Rest. Drink plenty of fluids.  ° °Follow up with your primary care physician this week as needed. Return to Urgent care for new or worsening concerns.  ° °

## 2016-03-12 NOTE — ED Triage Notes (Signed)
Bilat ear pain and fullness x2-3 days. Denies drainage.

## 2016-03-12 NOTE — ED Provider Notes (Signed)
[facility] ____________________________________________  Time seen: Approximately 3:42 PM  I have reviewed the triage vital signs and the nursing notes.   HISTORY  Chief Complaint Otalgia   HPI Robin Hayden is a 25 y.o. female presenting to the clinic to bilateral ear discomfort and fullness. Patient states that right ear greater than left. Patient reports still able to hear well, however hearing is slightly muffled. Denies any tinnitus or drainage from ears. Denies any trauma. Denies any recent runny nose, nasal congestion or cough. Denies fevers. Patient reports feels well otherwise. Denies any pain radiation.  Denies any recent sickness or recent antibiotic use.  Patient's last menstrual period was 02/27/2016 (exact date).    Past Medical History:  Diagnosis Date  . GERD (gastroesophageal reflux disease) 2014  . PNA (pneumonia) 1998    There are no active problems to display for this patient.   History reviewed. No pertinent surgical history.  No current facility-administered medications for this encounter.   Current Outpatient Prescriptions:  .  amoxicillin (AMOXIL) 875 MG tablet, Take 1 tablet (875 mg total) by mouth 2 (two) times daily., Disp: 20 tablet, Rfl: 0 .  chlorpheniramine-HYDROcodone (TUSSIONEX PENNKINETIC ER) 10-8 MG/5ML SUER, Take 5 mLs by mouth 2 (two) times daily., Disp: 115 mL, Rfl: 0 .  cyclobenzaprine (FLEXERIL) 5 MG tablet, Take 1 tablet (5 mg total) by mouth 3 (three) times daily as needed for muscle spasms. Take 1-2 tablets (10mg  max) by mouth 3 (three) times daily as needed for muscle spasms, Disp: 24 tablet, Rfl: 0 .  Diclofenac 18 MG CAPS, Take 18 mg by mouth 3 (three) times daily., Disp: 42 capsule, Rfl: 0 .  fluticasone (FLONASE) 50 MCG/ACT nasal spray, Place 2 sprays into both nostrils daily., Disp: 16 g, Rfl: 0 .  ondansetron (ZOFRAN ODT) 4 MG disintegrating tablet, Take 1 tablet (4 mg total) by mouth every 8 (eight)  hours as needed for nausea or vomiting., Disp: 20 tablet, Rfl: 0 .  traMADol (ULTRAM) 50 MG tablet, Take 1 tablet (50 mg total) by mouth every 6 (six) hours as needed., Disp: 6 tablet, Rfl: 0  Allergies Patient has no known allergies.  Family History  Problem Relation Age of Onset  . Diabetes Mother   . Hypertension Mother     Social History Social History  Substance Use Topics  . Smoking status: Never Smoker  . Smokeless tobacco: Never Used  . Alcohol use No    Review of Systems Constitutional: No fever/chills Eyes: No visual changes. ENT: No sore throat. As above.  Cardiovascular: Denies chest pain. Respiratory: Denies shortness of breath. Gastrointestinal: No abdominal pain.  No nausea, no vomiting.  No diarrhea.  No constipation. Genitourinary: Negative for dysuria. Musculoskeletal: Negative for back pain. Skin: Negative for rash. Neurological: Negative for headaches, focal weakness or numbness.  10-point ROS otherwise negative.  ____________________________________________   PHYSICAL EXAM:  VITAL SIGNS: ED Triage Vitals  Enc Vitals Group     BP 03/12/16 1531 112/72     Pulse Rate 03/12/16 1531 78     Resp 03/12/16 1531 16     Temp 03/12/16 1531 97.6 F (36.4 C)     Temp Source 03/12/16 1531 Oral     SpO2 03/12/16 1531 99 %     Weight 03/12/16 1533 220 lb (99.8 kg)     Height 03/12/16 1533 5\' 11"  (1.803 m)     Head Circumference --      Peak Flow --  Pain Score --      Pain Loc --      Pain Edu? --      Excl. in GC? --     Constitutional: Alert and oriented. Well appearing and in no acute distress. Eyes: Conjunctivae are normal. PERRL. EOMI. ENT      Head: Normocephalic and atraumatic.      Ears: bilateral total cerumen impaction present, no drainage, post cerumen removal- bilateral canals clear; Left: no erythema and normal TM. Right: moderate erythema and bulging TM. No surrounding erythema or tenderness bilaterally.       Nose: No  congestion/rhinnorhea.      Mouth/Throat: Mucous membranes are moist.Oropharynx non-erythematous. Neck: No stridor. Supple without meningismus.  Hematological/Lymphatic/Immunilogical: No cervical lymphadenopathy. Cardiovascular: Normal rate, regular rhythm. Grossly normal heart sounds.  Good peripheral circulation. Respiratory: Normal respiratory effort without tachypnea nor retractions. Breath sounds are clear and equal bilaterally. No wheezes/rales/rhonchi.. Musculoskeletal:  Ambulatory with steady gait. Neurologic:  Normal speech and language. Speech is normal. No gait instability.   Skin:  Skin is warm, dry and intact. No rash noted. Psychiatric: Mood and affect are normal. Speech and behavior are normal. Patient exhibits appropriate insight and judgment   ___________________________________________   LABS (all labs ordered are listed, but only abnormal results are displayed)  Labs Reviewed - No data to display   PROCEDURES Procedures   Ceruminosis is noted.  Wax is removed irrigation by RN. Instructions for home care to prevent wax buildup are given.  INITIAL IMPRESSION / ASSESSMENT AND PLAN / ED COURSE  Pertinent labs & imaging results that were available during my care of the patient were reviewed by me and considered in my medical decision making (see chart for details).   Well-appearing patient. No acute distress. Bilateral cerumen impaction noted, cerumen removed by irrigation by RN. Canals clear post irrigation. Right otitis media noted. Will treat patient with oral amoxicillin. Encouraged supportive care.Discussed indication, risks and benefits of medications with patient.  Discussed follow up with Primary care physician this week. Discussed follow up and return parameters including no resolution or any worsening concerns. Patient verbalized understanding and agreed to plan.   ____________________________________________   FINAL CLINICAL IMPRESSION(S) / ED  DIAGNOSES  Final diagnoses:  Bilateral impacted cerumen  Right otitis media, unspecified otitis media type     Discharge Medication List as of 03/12/2016  4:14 PM    START taking these medications   Details  amoxicillin (AMOXIL) 875 MG tablet Take 1 tablet (875 mg total) by mouth 2 (two) times daily., Starting Wed 03/12/2016, Normal        Note: This dictation was prepared with Dragon dictation along with smaller phrase technology. Any transcriptional errors that result from this process are unintentional.    Clinical Course       Renford DillsLindsey Danylah Holden, NP 03/12/16 1652

## 2016-05-22 ENCOUNTER — Ambulatory Visit
Admission: EM | Admit: 2016-05-22 | Discharge: 2016-05-22 | Disposition: A | Discharge status: Home / Self Care | Payer: BLUE CROSS/BLUE SHIELD | Attending: Family Medicine | Admitting: Family Medicine

## 2016-05-22 ENCOUNTER — Encounter: Payer: Self-pay | Admitting: *Deleted

## 2016-05-22 DIAGNOSIS — J40 Bronchitis, not specified as acute or chronic: Secondary | ICD-10-CM | POA: Diagnosis not present

## 2016-05-22 DIAGNOSIS — H6123 Impacted cerumen, bilateral: Secondary | ICD-10-CM

## 2016-05-22 DIAGNOSIS — J0100 Acute maxillary sinusitis, unspecified: Secondary | ICD-10-CM

## 2016-05-22 MED ORDER — MELOXICAM 7.5 MG PO TABS: 7.5000 mg | ORAL_TABLET | Freq: Two times a day (BID) | ORAL | 0 refills | Status: DC | PRN

## 2016-05-22 MED ORDER — AMOXICILLIN-POT CLAVULANATE 875-125 MG PO TABS: 1.0000 | ORAL_TABLET | Freq: Two times a day (BID) | ORAL | 0 refills | Status: AC

## 2016-05-22 MED ORDER — BENZONATATE 100 MG PO CAPS: 100.0000 mg | ORAL_CAPSULE | Freq: Three times a day (TID) | ORAL | 0 refills | Status: DC | PRN

## 2016-05-22 NOTE — ED Triage Notes (Signed)
Patient started having symptoms of productive cough, nasal congestion, fever, and body aches last PM.

## 2016-05-22 NOTE — Discharge Instructions (Signed)
Recommend start Augmentin 875mg  twice a day as directed. May take Tessalon cough pills 1 every 8 hours as needed. May use Mobic twice a day as needed for body aches. Recommend follow-up with your primary care provider in 4 to 5 days if not improving.

## 2016-05-22 NOTE — ED Provider Notes (Signed)
CSN: [id number]     Arrival date & time 05/22/16  1047 History   First MD Initiated Contact with Patient 05/22/16 1120     Chief Complaint  Patient presents with  . Nasal Congestion  . Cough  . Fever  . Generalized Body Aches   (Consider location/radiation/quality/duration/timing/severity/associated sxs/prior Treatment) 26 year old female presents with nasal congestion, cough, headache and body aches for over 1 week. Then yesterday evening symptoms worsened and she also developed chills, low grade fever and body aches. Denies any GI symptoms. Has taken Ibuprofen and OTC cold and flu medication with minimal relief. Mom also sick with URI symptoms. Also has history of ear wax build-up and requests "cleaning" of ears as needed today.    The history is provided by the patient.    Past Medical History:  Diagnosis Date  . GERD (gastroesophageal reflux disease) 2014  . PNA (pneumonia) 1998   History reviewed. No pertinent surgical history. Family History  Problem Relation Age of Onset  . Diabetes Mother   . Hypertension Mother    Social History  Substance Use Topics  . Smoking status: Never Smoker  . Smokeless tobacco: Never Used  . Alcohol use No   OB History    No data available     Review of Systems  Constitutional: Positive for appetite change, chills, fatigue and fever.  HENT: Positive for congestion, rhinorrhea, sinus pain, sinus pressure and sore throat. Negative for ear discharge and ear pain.   Eyes: Negative for discharge.  Respiratory: Positive for cough. Negative for chest tightness, shortness of breath and wheezing.   Cardiovascular: Negative for chest pain.  Gastrointestinal: Negative for abdominal pain, diarrhea, nausea and vomiting.  Musculoskeletal: Positive for arthralgias and myalgias. Negative for back pain and neck pain.  Skin: Negative for rash.  Neurological: Positive for headaches. Negative for dizziness, syncope, weakness and light-headedness.   Hematological: Negative for adenopathy.    Allergies  Patient has no known allergies.  Home Medications   Prior to Admission medications   Medication Sig Start Date End Date Taking? Authorizing Provider  amoxicillin-clavulanate (AUGMENTIN) 875-125 MG tablet Take 1 tablet by mouth every 12 (twelve) hours. 05/22/16 05/29/16  Sudie GrumblingAnn Berry Astor Gentle, NP  benzonatate (TESSALON) 100 MG capsule Take 1 capsule (100 mg total) by mouth 3 (three) times daily as needed for cough. 05/22/16   Sudie GrumblingAnn Berry Dequane Strahan, NP  meloxicam (MOBIC) 7.5 MG tablet Take 1 tablet (7.5 mg total) by mouth 2 (two) times daily as needed for pain. 05/22/16   Sudie GrumblingAnn Berry Laquasia Pincus, NP   Meds Ordered and Administered this Visit  Medications - No data to display  BP 109/73 (BP Location: Left Arm)   Pulse 91   Temp 98.1 F (36.7 C) (Oral)   Resp 16   Ht 5\' 11"  (1.803 m)   Wt 200 lb (90.7 kg)   LMP 05/01/2016   SpO2 100%   BMI 27.89 kg/m  No data found.   Physical Exam  Constitutional: She is oriented to person, place, and time. She appears well-developed and well-nourished. She appears ill. No distress.  HENT:  Head: Normocephalic and atraumatic.  Right Ear: Hearing and external ear normal.  Left Ear: Hearing and external ear normal.  Nose: Mucosal edema and rhinorrhea present. Right sinus exhibits maxillary sinus tenderness. Right sinus exhibits no frontal sinus tenderness. Left sinus exhibits maxillary sinus tenderness. Left sinus exhibits no frontal sinus tenderness.  Mouth/Throat: Uvula is midline and mucous membranes are normal. Posterior oropharyngeal erythema  present.  Both ear canals filled with soft yellow cerumen completely blocking TM visualization.   Neck: Normal range of motion. Neck supple.  Cardiovascular: Normal rate, regular rhythm and normal heart sounds.   Pulmonary/Chest: Effort normal. No respiratory distress. She has decreased breath sounds in the right upper field, the right lower field, the left upper field and  the left lower field. She has no wheezes. She has rhonchi in the right upper field and the left upper field. She has no rales.  Lymphadenopathy:    She has no cervical adenopathy.  Neurological: She is alert and oriented to person, place, and time.  Skin: Skin is warm and dry. Capillary refill takes less than 2 seconds.  Psychiatric: She has a normal mood and affect. Her behavior is normal. Judgment and thought content normal.    Urgent Care Course     .Ear Cerumen Removal Date/Time: 05/22/2016 11:30 AM Performed by: Carita Pian, Myrtie Leuthold BERRY Authorized by: Hassan Rowan   Consent:    Consent obtained:  Verbal   Consent given by:  Patient   Risks discussed:  Pain, incomplete removal and bleeding   Alternatives discussed:  No treatment, observation and referral Procedure details:    Location:  L ear and R ear   Procedure type: curette   Post-procedure details:    Inspection:  TM intact   Hearing quality:  Improved   Patient tolerance of procedure:  Tolerated well, no immediate complications Comments:     Cerumen removed bilaterally with success. Both TM's intact with no redness or fluid present.    (including critical care time)  Labs Review Labs Reviewed - No data to display  Imaging Review No results found.   Visual Acuity Review  Right Eye Distance:   Left Eye Distance:   Bilateral Distance:    Right Eye Near:   Left Eye Near:    Bilateral Near:         MDM   1. Bronchitis   2. Acute non-recurrent maxillary sinusitis   3. Bilateral impacted cerumen    Discussed with patient that clinical findings do not suggest pneumonia or influenza. Discussed that she probably has a sinus infection with bronchitis. Recommend start Augmentin 875mg  twice a day as directed. May take Tessalon cough pills 1 every 8 hours as needed. May use Mobic 7.5mg  twice a day as needed for body aches (declined Naproxen or Voltaren). Recommend follow-up with her primary care provider in 4 to 5 days  if not improving.      Sudie Grumbling, NP 05/22/16 [phone]

## 2016-11-09 ENCOUNTER — Emergency Department
Admission: EM | Admit: 2016-11-09 | Discharge: 2016-11-09 | Disposition: A | Discharge status: Home / Self Care | Payer: BLUE CROSS/BLUE SHIELD | Attending: Emergency Medicine | Admitting: Emergency Medicine

## 2016-11-09 ENCOUNTER — Ambulatory Visit
Admission: EM | Admit: 2016-11-09 | Discharge: 2016-11-09 | Disposition: A | Discharge status: Home / Self Care | Payer: BLUE CROSS/BLUE SHIELD | Source: Home / Self Care | Attending: Family Medicine | Admitting: Family Medicine

## 2016-11-09 ENCOUNTER — Emergency Department: Payer: BLUE CROSS/BLUE SHIELD

## 2016-11-09 DIAGNOSIS — R197 Diarrhea, unspecified: Secondary | ICD-10-CM

## 2016-11-09 DIAGNOSIS — N926 Irregular menstruation, unspecified: Secondary | ICD-10-CM | POA: Diagnosis not present

## 2016-11-09 DIAGNOSIS — Z79899 Other long term (current) drug therapy: Secondary | ICD-10-CM | POA: Insufficient documentation

## 2016-11-09 DIAGNOSIS — R109 Unspecified abdominal pain: Secondary | ICD-10-CM | POA: Diagnosis present

## 2016-11-09 DIAGNOSIS — R112 Nausea with vomiting, unspecified: Secondary | ICD-10-CM

## 2016-11-09 DIAGNOSIS — R1084 Generalized abdominal pain: Secondary | ICD-10-CM | POA: Diagnosis not present

## 2016-11-09 DIAGNOSIS — A084 Viral intestinal infection, unspecified: Secondary | ICD-10-CM | POA: Insufficient documentation

## 2016-11-09 LAB — URINALYSIS, COMPLETE (UACMP) WITH MICROSCOPIC
Bacteria, UA: NONE SEEN
Specific Gravity, Urine: 1.021 (ref 1.005–1.030)

## 2016-11-09 LAB — CBC
HCT: 44.1 % (ref 35.0–47.0)
Hemoglobin: 15.9 g/dL (ref 12.0–16.0)
MCH: 31.1 pg (ref 26.0–34.0)
MCHC: 36.1 g/dL — ABNORMAL HIGH (ref 32.0–36.0)
MCV: 86.2 fL (ref 80.0–100.0)
Platelets: 224 10*3/uL (ref 150–440)
RBC: 5.11 MIL/uL (ref 3.80–5.20)
RDW: 12.5 % (ref 11.5–14.5)
WBC: 10.7 10*3/uL (ref 3.6–11.0)

## 2016-11-09 LAB — LIPASE, BLOOD: Lipase: 25 U/L (ref 11–51)

## 2016-11-09 LAB — COMPREHENSIVE METABOLIC PANEL
ALT: 14 U/L (ref 14–54)
AST: 19 U/L (ref 15–41)
Albumin: 3.9 g/dL (ref 3.5–5.0)
Alkaline Phosphatase: 47 U/L (ref 38–126)
Anion gap: 7 (ref 5–15)
BUN: 13 mg/dL (ref 6–20)
CO2: 22 mmol/L (ref 22–32)
Calcium: 8.6 mg/dL — ABNORMAL LOW (ref 8.9–10.3)
Chloride: 107 mmol/L (ref 101–111)
Creatinine, Ser: 0.63 mg/dL (ref 0.44–1.00)
GFR calc Af Amer: >60 mL/min (ref >60)
GFR calc non Af Amer: >60 mL/min (ref >60)
Glucose, Bld: 89 mg/dL (ref 65–99)
Potassium: 4 mmol/L (ref 3.5–5.1)
Sodium: 136 mmol/L (ref 135–145)
Total Bilirubin: 1.1 mg/dL (ref 0.3–1.2)
Total Protein: 7.2 g/dL (ref 6.5–8.1)

## 2016-11-09 LAB — HCG, QUANTITATIVE, PREGNANCY: hCG, Beta Chain, Quant, S: <1 m[IU]/mL (ref <5)

## 2016-11-09 MED ORDER — FAMOTIDINE 20 MG PO TABS: 20.0000 mg | ORAL_TABLET | Freq: Two times a day (BID) | ORAL | 0 refills | Status: DC

## 2016-11-09 MED ORDER — DICYCLOMINE HCL 20 MG PO TABS: 20.0000 mg | ORAL_TABLET | Freq: Three times a day (TID) | ORAL | 0 refills | Status: DC | PRN

## 2016-11-09 MED ORDER — ONDANSETRON HCL 4 MG/2ML IJ SOLN
4.0000 mg | Freq: Once | INTRAMUSCULAR | Status: AC
  Administered 2016-11-09: 4 mg via INTRAVENOUS
  Filled 2016-11-09: qty 2

## 2016-11-09 MED ORDER — ONDANSETRON 4 MG PO TBDP: 4.0000 mg | ORAL_TABLET | Freq: Three times a day (TID) | ORAL | 0 refills | Status: DC | PRN

## 2016-11-09 MED ORDER — IOPAMIDOL (ISOVUE-300) INJECTION 61%
100.0000 mL | Freq: Once | INTRAVENOUS | Status: AC | PRN
  Administered 2016-11-09: 100 mL via INTRAVENOUS

## 2016-11-09 MED ORDER — KETOROLAC TROMETHAMINE 30 MG/ML IJ SOLN
15.0000 mg | INTRAMUSCULAR | Status: AC
  Administered 2016-11-09: 15 mg via INTRAVENOUS
  Filled 2016-11-09: qty 1

## 2016-11-09 MED ORDER — NAPROXEN 500 MG PO TABS: 500.0000 mg | ORAL_TABLET | Freq: Two times a day (BID) | ORAL | 0 refills | Status: DC

## 2016-11-09 MED ORDER — FAMOTIDINE IN NACL 20-0.9 MG/50ML-% IV SOLN
20.0000 mg | Freq: Once | INTRAVENOUS | Status: AC
  Administered 2016-11-09: 20 mg via INTRAVENOUS
  Filled 2016-11-09: qty 50

## 2016-11-09 MED ORDER — SODIUM CHLORIDE 0.9 % IV BOLUS (SEPSIS)
1000.0000 mL | Freq: Once | INTRAVENOUS | Status: AC
  Administered 2016-11-09: 1000 mL via INTRAVENOUS

## 2016-11-09 NOTE — ED Provider Notes (Signed)
[facility]    CSN: [id number] Arrival date & time: 11/09/16  0803  History   Chief Complaint Chief Complaint  Patient presents with  . Emesis   HPI 26 year old female presents with nausea, vomiting, Diarrhea, and abdominal pain.  Patient states that she developed nausea and diarrhea on Friday. She had some improvement on Saturday and then acutely worsened last night. She's had severe abdominal pain, nausea, vomiting, and diarrhea. She's been unable to keep anything down. She reports associated fever and chills. Also states that she's having headache and dizziness. No known inciting factor. No known exacerbating or relieving factors. No reports of hematochezia or melena. No coffee-ground emesis or hematemesis. No other associated symptoms. No other complaints or concerns at this time.   Past Medical History:  Diagnosis Date  . GERD (gastroesophageal reflux disease) 2014  . PNA (pneumonia) 1998   There are no active problems to display for this patient.  History reviewed. No pertinent surgical history.  OB History    No data available       Home Medications    Prior to Admission medications   Medication Sig Start Date End Date Taking? Authorizing Provider  benzonatate (TESSALON) 100 MG capsule Take 1 capsule (100 mg total) by mouth 3 (three) times daily as needed for cough. 05/22/16   Sudie GrumblingAmyot, Ann Berry, NP  meloxicam (MOBIC) 7.5 MG tablet Take 1 tablet (7.5 mg total) by mouth 2 (two) times daily as needed for pain. 05/22/16   Sudie GrumblingAmyot, Ann Berry, NP    Family History Family History  Problem Relation Age of Onset  . Diabetes Mother   . Hypertension Mother     Social History Social History  Substance Use Topics  . Smoking status: Never Smoker  . Smokeless tobacco: Never Used  . Alcohol use No     Allergies   Patient has no known allergies.   Review of Systems Review of Systems  Constitutional: Positive for chills and fever.  Gastrointestinal:  Positive for abdominal pain, diarrhea, nausea and vomiting.  Musculoskeletal:       Body aches.   Physical Exam Triage Vital Signs ED Triage Vitals  Enc Vitals Group     BP 11/09/16 0817 93/62     Pulse Rate 11/09/16 0817 80     Resp 11/09/16 0817 16     Temp 11/09/16 0817 98.1 F (36.7 C)     Temp Source 11/09/16 0817 Oral     SpO2 11/09/16 0817 100 %     Weight 11/09/16 0820 220 lb (99.8 kg)     Height 11/09/16 0820 5\' 11"  (1.803 m)     Head Circumference --      Peak Flow --      Pain Score 11/09/16 0816 8     Pain Loc --      Pain Edu? --      Excl. in GC? --    Updated Vital Signs BP 93/62   Pulse 80   Temp 98.1 F (36.7 C) (Oral)   Resp 16   Ht 5\' 11"  (1.803 m)   Wt 220 lb (99.8 kg)   LMP 11/08/2016   SpO2 100%   BMI 30.68 kg/m     Physical Exam  Constitutional: She is oriented to person, place, and time. She appears well-developed.  Appears ill.  HENT:  Head: Normocephalic and atraumatic.  Oropharynx clear. Mucosa is fairly moist.  Eyes: Conjunctivae are normal. No scleral icterus.  Cardiovascular: Normal rate  and regular rhythm.   No murmur heard. Pulmonary/Chest: Effort normal and breath sounds normal. No respiratory distress. She has no wheezes. She has no rales.  Abdominal:  Soft, nondistended. Diffusely tender to palpation, especially in the periumbilical region.  Neurological: She is alert and oriented to person, place, and time.  Psychiatric: She has a normal mood and affect.  Vitals reviewed.  UC Treatments / Results  Labs (all labs ordered are listed, but only abnormal results are displayed) Labs Reviewed - No data to display  EKG  EKG Interpretation None       Radiology No results found.  Procedures Procedures (including critical care time)  Medications Ordered in UC Medications - No data to display   Initial Impression / Assessment and Plan / UC Course  I have reviewed the triage vital signs and the nursing  notes.  Pertinent labs & imaging results that were available during my care of the patient were reviewed by me and considered in my medical decision making (see chart for details).     26 year old female presents with nausea, vomiting, diarrhea and generalized abdominal pain. Does not appear well and has been unable to keep anything down. I advised her that her best course of action is to go to the ER for labs, IV fluids, and further monitoring. She and her mother are in agreement. Sending directly to the ER.  Final Clinical Impressions(s) / UC Diagnoses   Final diagnoses:  Nausea vomiting and diarrhea  Generalized abdominal pain   New Prescriptions New Prescriptions   No medications on file   Controlled Substance Prescriptions [place] Controlled Substance Registry consulted? Not Applicable   Tommie Sams, [place] 11/09/16 [phone]

## 2016-11-09 NOTE — ED Triage Notes (Signed)
As per patient onset 2 days ago had nausea and diarrhea and body ache and chills and abdominal pain which is getting worst today fever was 100 Friday.

## 2016-11-09 NOTE — ED Notes (Signed)
Pt discharged to home.  Family member driving.  Discharge instructions reviewed.  Verbalized understanding.  No questions or concerns at this time.  Teach back verified.  Pt in NAD.  No items left in ED.   

## 2016-11-09 NOTE — ED Triage Notes (Signed)
Pt presents via POV c/o N/V/D since Friday. Reports having generalized body aches and abd cramping starting at 0200 this am. Sent from Urgent care.

## 2016-11-09 NOTE — Discharge Instructions (Signed)
Results for orders placed or performed during the hospital encounter of 11/09/16  Lipase, blood  Result Value Ref Range   Lipase 25 11 - 51 U/L  Comprehensive metabolic panel  Result Value Ref Range   Sodium 136 135 - 145 mmol/L   Potassium 4.0 3.5 - 5.1 mmol/L   Chloride 107 101 - 111 mmol/L   CO2 22 22 - 32 mmol/L   Glucose, Bld 89 65 - 99 mg/dL   BUN 13 6 - 20 mg/dL   Creatinine, Ser 8.410.63 0.44 - 1.00 mg/dL   Calcium 8.6 (L) 8.9 - 10.3 mg/dL   Total Protein 7.2 6.5 - 8.1 g/dL   Albumin 3.9 3.5 - 5.0 g/dL   AST 19 15 - 41 U/L   ALT 14 14 - 54 U/L   Alkaline Phosphatase 47 38 - 126 U/L   Total Bilirubin 1.1 0.3 - 1.2 mg/dL   GFR calc non Af Amer >60 >60 mL/min   GFR calc Af Amer >60 >60 mL/min   Anion gap 7 5 - 15  CBC  Result Value Ref Range   WBC 10.7 3.6 - 11.0 K/uL   RBC 5.11 3.80 - 5.20 MIL/uL   Hemoglobin 15.9 12.0 - 16.0 g/dL   HCT 32.444.1 40.135.0 - 02.747.0 %   MCV 86.2 80.0 - 100.0 fL   MCH 31.1 26.0 - 34.0 pg   MCHC 36.1 (H) 32.0 - 36.0 g/dL   RDW 25.312.5 66.411.5 - 40.314.5 %   Platelets 224 150 - 440 K/uL  hCG, quantitative, pregnancy  Result Value Ref Range   hCG, Beta Chain, Quant, S <1 <5 mIU/mL   Ct Abdomen Pelvis W Contrast  Result Date: 11/09/2016 CLINICAL DATA:  Abdominal cramping and body aches since 0200 hours this morning, nausea, vomiting and diarrhea for 3 days, history GERD EXAM: CT ABDOMEN AND PELVIS WITH CONTRAST TECHNIQUE: Multidetector CT imaging of the abdomen and pelvis was performed using the standard protocol following bolus administration of intravenous contrast. Sagittal and coronal MPR images reconstructed from axial data set. CONTRAST:  100mL ISOVUE-300 IOPAMIDOL (ISOVUE-300) INJECTION 61% 100 cc Isovue-300 IV COMPARISON:  None FINDINGS: Lower chest: Linear scarring LEFT lower lobe. 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 Adrenal glands, kidneys, ureters, and bladder otherwise normal appearance Stomach/Bowel: Normal appendix. Stomach decompressed. Bowel loops unremarkable. Vascular/Lymphatic: Unremarkable Reproductive: Uterus and ovaries unremarkable Other: Minimal free pelvic fluid, potentially physiologic. No significant ascites. No free air. No hernia. Musculoskeletal: Osseous structures unremarkable. IMPRESSION: No acute intra-abdominal or intrapelvic abnormalities. Electronically Signed   By: Ulyses SouthwardMark  Boles M.D.   On: 11/09/2016 10:46

## 2016-11-09 NOTE — ED Provider Notes (Signed)
[facility] Emergency Department Provider Note  ____________________________________________  Time seen: Approximately 9:17 AM  I have reviewed the triage vital signs and the nursing notes.   HISTORY  Chief Complaint Abdominal Pain    HPI Robin Hayden is a 26 y.o. female who complains of nausea vomiting diarrhea and generalized abdominal pain worsening for the past 2 days. It's cramping, nonradiating, severe, no aggravating or alleviating factors, able to tolerate oral intake was salmon and rice last night. Denies sick contacts, does have body aches and subjective fevers and chills. Reports she's never had an illness like this before.  Currently on her period, started yesterday. Periods are irregular.   Past Medical History:  Diagnosis Date  . GERD (gastroesophageal reflux disease) 2014  . PNA (pneumonia) 1998     There are no active problems to display for this patient.    No past surgical history on file. None  Prior to Admission medications   Medication Sig Start Date End Date Taking? Authorizing Provider  benzonatate (TESSALON) 100 MG capsule Take 1 capsule (100 mg total) by mouth 3 (three) times daily as needed for cough. Patient not taking: Reported on 11/09/2016 05/22/16   Sudie Grumbling, NP  dicyclomine (BENTYL) 20 MG tablet Take 1 tablet (20 mg total) by mouth 3 (three) times daily as needed for spasms. 11/09/16   Sharman Cheek, MD  famotidine (PEPCID) 20 MG tablet Take 1 tablet (20 mg total) by mouth 2 (two) times daily. 11/09/16   Sharman Cheek, MD  meloxicam (MOBIC) 7.5 MG tablet Take 1 tablet (7.5 mg total) by mouth 2 (two) times daily as needed for pain. Patient not taking: Reported on 11/09/2016 05/22/16   Sudie Grumbling, NP  naproxen (NAPROSYN) 500 MG tablet Take 1 tablet (500 mg total) by mouth 2 (two) times daily with a meal. 11/09/16   Sharman Cheek, MD  ondansetron (ZOFRAN ODT) 4 MG disintegrating tablet Take  1 tablet (4 mg total) by mouth every 8 (eight) hours as needed for nausea or vomiting. 11/09/16   Sharman Cheek, MD     Allergies Patient has no known allergies.   Family History  Problem Relation Age of Onset  . Diabetes Mother   . Hypertension Mother     Social History Social History  Substance Use Topics  . Smoking status: Never Smoker  . Smokeless tobacco: Never Used  . Alcohol use No    Review of Systems  Constitutional:   Positive subjective fever or chills.  ENT:   No sore throat. No rhinorrhea. Cardiovascular:   No chest pain or syncope. Respiratory:   No dyspnea or cough. Gastrointestinal:   Positive as above for abdominal pain, vomiting and diarrhea.  Musculoskeletal:   Negative for focal pain or swelling All other systems reviewed and are negative except as documented above in ROS and HPI.  ____________________________________________   PHYSICAL EXAM:  VITAL SIGNS: ED Triage Vitals  Enc Vitals Group     BP 11/09/16 0855 96/73     Pulse Rate 11/09/16 0855 80     Resp 11/09/16 0855 14     Temp 11/09/16 0855 98.3 F (36.8 C)     Temp Source 11/09/16 0855 Oral     SpO2 11/09/16 0855 99 %     Weight 11/09/16 0856 210 lb (95.3 kg)     Height 11/09/16 0856 5\' 11"  (1.803 m)     Head Circumference --      Peak Flow --  Pain Score 11/09/16 0855 9     Pain Loc --      Pain Edu? --      Excl. in GC? --     Vital signs reviewed, nursing assessments reviewed.   Constitutional:   Alert and oriented.Not in distress. Eyes:   No scleral icterus.  EOMI. No nystagmus. No conjunctival pallor. PERRL. ENT   Head:   Normocephalic and atraumatic.   Nose:   No congestion/rhinnorhea.    Mouth/Throat:   MMM, no pharyngeal erythema. No peritonsillar mass.    Neck:   No meningismus. Full ROM Hematological/Lymphatic/Immunilogical:   No cervical lymphadenopathy. Cardiovascular:   RRR. Symmetric bilateral radial and DP pulses.  No murmurs.   Respiratory:   Normal respiratory effort without tachypnea/retractions. Breath sounds are clear and equal bilaterally. No wheezes/rales/rhonchi. Gastrointestinal:   Soft with generalized tenderness, possibly worse in the right lower quadrant, although exam is somewhat inconsistent. Non distended. There is no CVA tenderness.  No rebound, rigidity, or guarding. Genitourinary:   deferred Musculoskeletal:   Normal range of motion in all extremities. No joint effusions.  No lower extremity tenderness.  No edema. Neurologic:   Normal speech and language.  Motor grossly intact. No gross focal neurologic deficits are appreciated.  Skin:    Skin is warm, dry and intact. No rash noted.  No petechiae, purpura, or bullae.  ____________________________________________    LABS (pertinent positives/negatives) (all labs ordered are listed, but only abnormal results are displayed) Labs Reviewed  COMPREHENSIVE METABOLIC PANEL - Abnormal; Notable for the following:       Result Value   Calcium 8.6 (*)    All other components within normal limits  CBC - Abnormal; Notable for the following:    MCHC 36.1 (*)    All other components within normal limits  LIPASE, BLOOD  HCG, QUANTITATIVE, PREGNANCY  URINALYSIS, COMPLETE (UACMP) WITH MICROSCOPIC   ____________________________________________   EKG    ____________________________________________    RADIOLOGY  Ct Abdomen Pelvis W Contrast  Result Date: 11/09/2016 CLINICAL DATA:  Abdominal cramping and body aches since 0200 hours this morning, nausea, vomiting and diarrhea for 3 days, history GERD EXAM: CT ABDOMEN AND PELVIS WITH CONTRAST TECHNIQUE: Multidetector CT imaging of the abdomen and pelvis was performed using the standard protocol following bolus administration of intravenous contrast. Sagittal and coronal MPR images reconstructed from axial data set. CONTRAST:  100mL ISOVUE-300 IOPAMIDOL (ISOVUE-300) INJECTION 61% 100 cc Isovue-300 IV  COMPARISON:  None FINDINGS: Lower chest: Linear scarring LEFT lower lobe. 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 Adrenal glands, kidneys, ureters, and bladder otherwise normal appearance Stomach/Bowel: Normal appendix. Stomach decompressed. Bowel loops unremarkable. Vascular/Lymphatic: Unremarkable Reproductive: Uterus and ovaries unremarkable Other: Minimal free pelvic fluid, potentially physiologic. No significant ascites. No free air. No hernia. Musculoskeletal: Osseous structures unremarkable. IMPRESSION: No acute intra-abdominal or intrapelvic abnormalities. Electronically Signed   By: Ulyses SouthwardMark  Boles M.D.   On: 11/09/2016 10:46    ____________________________________________   PROCEDURES Procedures  ____________________________________________   INITIAL IMPRESSION / ASSESSMENT AND PLAN / ED COURSE  Pertinent labs & imaging results that were available during my care of the patient were reviewed by me and considered in my medical decision making (see chart for details).  Patient presents with generalized abdominal pain nausea vomiting diarrhea, possibly gastroenteritis accentuated by current menses, but with her tenderness on exam being somewhat worse in the right  lower quadrant than ever rales, we'll get a CT scan to evaluate primarily for appendicitis. In the meantime give IV fluids, Toradol, Zofran for symptom relief. Vital signs are normal. If workup is reassuring thing patient will be suitable for discharge home and outpatient follow-up.       ----------------------------------------- 10:54 AM on 11/09/2016 -----------------------------------------  Workup negative. Patient now indicates that she has learned from a friend that she has identical symptoms. Likely to be viral gastroenteritis. Symptomatic management and PCP  follow-up.Considering the patient's symptoms, medical history, and physical examination today, I have low suspicion for cholecystitis or biliary pathology, pancreatitis, perforation or bowel obstruction, hernia, intra-abdominal abscess, AAA or dissection, volvulus or intussusception, mesenteric ischemia, or appendicitis.  Low suspicion for STI PID TOA or torsion ____________________________________________   FINAL CLINICAL IMPRESSION(S) / ED DIAGNOSES  Final diagnoses:  Generalized abdominal pain  Viral gastroenteritis      New Prescriptions   DICYCLOMINE (BENTYL) 20 MG TABLET    Take 1 tablet (20 mg total) by mouth 3 (three) times daily as needed for spasms.   FAMOTIDINE (PEPCID) 20 MG TABLET    Take 1 tablet (20 mg total) by mouth 2 (two) times daily.   NAPROXEN (NAPROSYN) 500 MG TABLET    Take 1 tablet (500 mg total) by mouth 2 (two) times daily with a meal.   ONDANSETRON (ZOFRAN ODT) 4 MG DISINTEGRATING TABLET    Take 1 tablet (4 mg total) by mouth every 8 (eight) hours as needed for nausea or vomiting.     Portions of this note were generated with dragon dictation software. Dictation errors may occur despite best attempts at proofreading.    Sharman Cheek, MD 11/09/16 1055

## 2017-03-15 ENCOUNTER — Other Ambulatory Visit: Payer: Self-pay

## 2017-03-15 ENCOUNTER — Encounter: Payer: Self-pay | Admitting: Gynecology

## 2017-03-15 ENCOUNTER — Ambulatory Visit
Admission: EM | Admit: 2017-03-15 | Discharge: 2017-03-15 | Disposition: A | Discharge status: Home / Self Care | Payer: BLUE CROSS/BLUE SHIELD | Attending: Family Medicine | Admitting: Family Medicine

## 2017-03-15 DIAGNOSIS — R112 Nausea with vomiting, unspecified: Secondary | ICD-10-CM | POA: Diagnosis not present

## 2017-03-15 DIAGNOSIS — R51 Headache: Secondary | ICD-10-CM

## 2017-03-15 DIAGNOSIS — R197 Diarrhea, unspecified: Secondary | ICD-10-CM | POA: Diagnosis not present

## 2017-03-15 DIAGNOSIS — A084 Viral intestinal infection, unspecified: Secondary | ICD-10-CM

## 2017-03-15 LAB — URINALYSIS, COMPLETE (UACMP) WITH MICROSCOPIC
Bilirubin Urine: NEGATIVE
Glucose, UA: NEGATIVE mg/dL
Ketones, ur: NEGATIVE mg/dL
Leukocytes, UA: NEGATIVE
Nitrite: NEGATIVE
Protein, ur: 30 mg/dL — AB
Specific Gravity, Urine: 1.02 (ref 1.005–1.030)
pH: 6.5 (ref 5.0–8.0)

## 2017-03-15 LAB — COMPREHENSIVE METABOLIC PANEL
ALT: 22 U/L (ref 14–54)
AST: 23 U/L (ref 15–41)
Albumin: 4.2 g/dL (ref 3.5–5.0)
Alkaline Phosphatase: 47 U/L (ref 38–126)
Anion gap: 7 (ref 5–15)
BUN: 14 mg/dL (ref 6–20)
CO2: 23 mmol/L (ref 22–32)
Calcium: 8.5 mg/dL — ABNORMAL LOW (ref 8.9–10.3)
Chloride: 105 mmol/L (ref 101–111)
Creatinine, Ser: 0.59 mg/dL (ref 0.44–1.00)
GFR calc Af Amer: >60 mL/min (ref >60)
GFR calc non Af Amer: >60 mL/min (ref >60)
Glucose, Bld: 86 mg/dL (ref 65–99)
Potassium: 4.1 mmol/L (ref 3.5–5.1)
Sodium: 135 mmol/L (ref 135–145)
Total Bilirubin: 1 mg/dL (ref 0.3–1.2)
Total Protein: 7.8 g/dL (ref 6.5–8.1)

## 2017-03-15 LAB — LIPASE, BLOOD: Lipase: 19 U/L (ref 11–51)

## 2017-03-15 MED ORDER — ONDANSETRON 4 MG PO TBDP
4.0000 mg | ORAL_TABLET | Freq: Once | ORAL | Status: AC
  Administered 2017-03-15: 4 mg via ORAL

## 2017-03-15 MED ORDER — FAMOTIDINE 20 MG PO TABS: 20.0000 mg | ORAL_TABLET | Freq: Two times a day (BID) | ORAL | 0 refills | Status: DC

## 2017-03-15 MED ORDER — ONDANSETRON 8 MG PO TBDP: 8.0000 mg | ORAL_TABLET | Freq: Three times a day (TID) | ORAL | 0 refills | Status: DC | PRN

## 2017-03-15 NOTE — ED Triage Notes (Signed)
Per patient  NVD x 1 week. Per patient today stomach ache / chills  And headache

## 2017-03-15 NOTE — ED Provider Notes (Signed)
[facility]    CSN: [id number] Arrival date & time: 03/15/17  1307     History   Chief Complaint Chief Complaint  Patient presents with  . GI Problem    HPI Robin Hayden is a [age] y.o. female.   26 yo female with a c/o nausea, vomiting and diarrhea for 5 days associated with chills and headache. Denies any melena, hematochezia, hematemesis.    The history is provided by the patient.    Past Medical History:  Diagnosis Date  . GERD (gastroesophageal reflux disease) 2014  . PNA (pneumonia) 1998    There are no active problems to display for this patient.   History reviewed. No pertinent surgical history.  OB History    No data available       Home Medications    Prior to Admission medications   Medication Sig Start Date End Date Taking? Authorizing Provider  benzonatate (TESSALON) 100 MG capsule Take 1 capsule (100 mg total) by mouth 3 (three) times daily as needed for cough. Patient not taking: Reported on 11/09/2016 05/22/16   Sudie GrumblingAmyot, Ann Berry, NP  dicyclomine (BENTYL) 20 MG tablet Take 1 tablet (20 mg total) by mouth 3 (three) times daily as needed for spasms. 11/09/16   Sharman CheekStafford, Phillip, MD  famotidine (PEPCID) 20 MG tablet Take 1 tablet (20 mg total) by mouth 2 (two) times daily. 03/15/17   Payton Mccallumonty, Syvanna Ciolino, MD  meloxicam (MOBIC) 7.5 MG tablet Take 1 tablet (7.5 mg total) by mouth 2 (two) times daily as needed for pain. Patient not taking: Reported on 11/09/2016 05/22/16   Sudie GrumblingAmyot, Ann Berry, NP  naproxen (NAPROSYN) 500 MG tablet Take 1 tablet (500 mg total) by mouth 2 (two) times daily with a meal. 11/09/16   Sharman CheekStafford, Phillip, MD  ondansetron (ZOFRAN ODT) 8 MG disintegrating tablet Take 1 tablet (8 mg total) by mouth every 8 (eight) hours as needed. 03/15/17   Payton Mccallumonty, Stephan Nelis, MD    Family History Family History  Problem Relation Age of Onset  . Diabetes Mother   . Hypertension Mother     Social History Social History   Tobacco Use    . Smoking status: Never Smoker  . Smokeless tobacco: Never Used  Substance Use Topics  . Alcohol use: No  . Drug use: No     Allergies   Patient has no known allergies.   Review of Systems Review of Systems   Physical Exam Triage Vital Signs ED Triage Vitals  Enc Vitals Group     BP 03/15/17 1353 121/83     Pulse Rate 03/15/17 1353 63     Resp 03/15/17 1353 16     Temp 03/15/17 1353 98.1 F (36.7 C)     Temp Source 03/15/17 1353 Oral     SpO2 03/15/17 1353 99 %     Weight 03/15/17 1351 200 lb (90.7 kg)     Height 03/15/17 1351 5\' 11"  (1.803 m)     Head Circumference --      Peak Flow --      Pain Score 03/15/17 1352 5     Pain Loc --      Pain Edu? --      Excl. in GC? --    No data found.  Updated Vital Signs BP 121/83 (BP Location: Left Arm)   Pulse 63   Temp 98.1 F (36.7 C) (Oral)   Resp 16   Ht 5\' 11"  (1.803 m)   Wt 200 lb (  90.7 kg)   LMP 02/21/2017   SpO2 99%   BMI 27.89 kg/m   Visual Acuity Right Eye Distance:   Left Eye Distance:   Bilateral Distance:    Right Eye Near:   Left Eye Near:    Bilateral Near:     Physical Exam  Constitutional: She appears well-developed and well-nourished. No distress.  Abdominal: Soft. Bowel sounds are normal. She exhibits no distension and no mass. There is tenderness (mild, diffuse). There is no rebound and no guarding.  Skin: She is not diaphoretic.  Nursing note and vitals reviewed.    UC Treatments / Results  Labs (all labs ordered are listed, but only abnormal results are displayed) Labs Reviewed  COMPREHENSIVE METABOLIC PANEL - Abnormal; Notable for the following components:      Result Value   Calcium 8.5 (*)    All other components within normal limits  LIPASE, BLOOD  URINALYSIS, COMPLETE (UACMP) WITH MICROSCOPIC    EKG  EKG Interpretation None       Radiology No results found.  Procedures Procedures (including critical care time)  Medications Ordered in UC Medications   ondansetron (ZOFRAN-ODT) disintegrating tablet 4 mg (4 mg Oral Given 03/15/17 1400)  ondansetron (ZOFRAN-ODT) disintegrating tablet 4 mg (4 mg Oral Given 03/15/17 1523)     Initial Impression / Assessment and Plan / UC Course  I have reviewed the triage vital signs and the nursing notes.  Pertinent labs & imaging results that were available during my care of the patient were reviewed by me and considered in my medical decision making (see chart for details).        Final Clinical Impressions(s) / UC Diagnoses   Final diagnoses:  Viral gastroenteritis    ED Discharge Orders        Ordered    ondansetron (ZOFRAN ODT) 8 MG disintegrating tablet  Every 8 hours PRN     03/15/17 1555    famotidine (PEPCID) 20 MG tablet  2 times daily     03/15/17 1555     1. Lab results and diagnosis reviewed with patient 2. rx as per orders above; reviewed possible side effects, interactions, risks and benefits  3. Recommend supportive treatment with clear liquids then advance slowly as tolerated  4. Follow-up prn if symptoms worsen or don't improve   Controlled Substance Prescriptions  Controlled Substance Registry consulted? Not Applicable   Payton Mccallumonty, Luetta Piazza, MD 03/15/17 [phone]

## 2017-03-16 LAB — URINE CULTURE: Special Requests: NORMAL

## 2017-06-01 ENCOUNTER — Encounter: Payer: Self-pay | Admitting: *Deleted

## 2017-06-01 ENCOUNTER — Ambulatory Visit
Admission: EM | Admit: 2017-06-01 | Discharge: 2017-06-01 | Disposition: A | Discharge status: Home / Self Care | Payer: BLUE CROSS/BLUE SHIELD | Attending: Family Medicine | Admitting: Family Medicine

## 2017-06-01 DIAGNOSIS — S161XXA Strain of muscle, fascia and tendon at neck level, initial encounter: Secondary | ICD-10-CM | POA: Diagnosis not present

## 2017-06-01 MED ORDER — METAXALONE 800 MG PO TABS: 800.0000 mg | ORAL_TABLET | Freq: Three times a day (TID) | ORAL | 0 refills | Status: DC

## 2017-06-01 MED ORDER — MELOXICAM 15 MG PO TABS: 15.0000 mg | ORAL_TABLET | Freq: Every day | ORAL | 0 refills | Status: DC

## 2017-06-01 NOTE — Discharge Instructions (Signed)
Ice 20 minutes out of every 2 hours 4-5 times daily for comfort.
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
## 2017-06-15 ENCOUNTER — Ambulatory Visit
Admission: EM | Admit: 2017-06-15 | Discharge: 2017-06-15 | Disposition: A | Discharge status: Home / Self Care | Payer: BLUE CROSS/BLUE SHIELD | Attending: Family Medicine | Admitting: Family Medicine

## 2017-06-15 ENCOUNTER — Encounter: Payer: Self-pay | Admitting: Emergency Medicine

## 2017-06-15 ENCOUNTER — Other Ambulatory Visit: Payer: Self-pay

## 2017-06-15 DIAGNOSIS — A084 Viral intestinal infection, unspecified: Secondary | ICD-10-CM

## 2017-06-15 DIAGNOSIS — K2900 Acute gastritis without bleeding: Secondary | ICD-10-CM | POA: Diagnosis not present

## 2017-06-15 MED ORDER — FAMOTIDINE 20 MG PO TABS: 20.0000 mg | ORAL_TABLET | Freq: Two times a day (BID) | ORAL | 0 refills | Status: DC

## 2017-06-15 MED ORDER — ONDANSETRON 8 MG PO TBDP: 8.0000 mg | ORAL_TABLET | Freq: Three times a day (TID) | ORAL | 0 refills | Status: DC | PRN

## 2017-06-15 NOTE — ED Provider Notes (Signed)
[facility]    CSN: [id number] Arrival date & time: 06/15/17  1829     History   Chief Complaint Chief Complaint  Patient presents with  . Abdominal Pain    HPI Robin Hayden is a 27 y.o. female.   The history is provided by the patient.  Abdominal Pain  Pain location:  Generalized Pain quality: aching   Pain radiates to:  Does not radiate Pain severity:  Mild (mild to moderate) Onset quality:  Sudden Timing:  Intermittent Progression:  Waxing and waning Chronicity:  New Context: not alcohol use, not awakening from sleep, not diet changes, not eating, not laxative use, not medication withdrawal, not previous surgeries, not recent illness, not recent sexual activity, not recent travel, not retching, not sick contacts, not suspicious food intake and not trauma   Relieved by:  Nothing Ineffective treatments:  OTC medications, NSAIDs and antacids Associated symptoms: diarrhea, nausea and vomiting   Associated symptoms: no anorexia, no belching, no chest pain, no chills, no constipation, no cough, no dysuria, no fatigue, no fever, no flatus, no hematemesis, no hematochezia, no hematuria, no melena, no shortness of breath, no sore throat, no vaginal bleeding and no vaginal discharge   Risk factors: NSAID use and obesity   Risk factors: no alcohol abuse, no aspirin use, not elderly, has not had multiple surgeries, not pregnant and no recent hospitalization     Past Medical History:  Diagnosis Date  . GERD (gastroesophageal reflux disease) 2014  . PNA (pneumonia) 1998    There are no active problems to display for this patient.   History reviewed. No pertinent surgical history.  OB History    No data available       Home Medications    Prior to Admission medications   Medication Sig Start Date End Date Taking? Authorizing Provider  famotidine (PEPCID) 20 MG tablet Take 1 tablet (20 mg total) by mouth 2 (two) times daily. 06/15/17   Payton Mccallumonty,  Dreama Kuna, MD  meloxicam (MOBIC) 15 MG tablet Take 1 tablet (15 mg total) by mouth daily. 06/01/17   Lutricia Feiloemer, William P, PA-C  metaxalone (SKELAXIN) 800 MG tablet Take 1 tablet (800 mg total) by mouth 3 (three) times daily. 06/01/17   Lutricia Feiloemer, William P, PA-C  ondansetron (ZOFRAN ODT) 8 MG disintegrating tablet Take 1 tablet (8 mg total) by mouth every 8 (eight) hours as needed. 06/15/17   Payton Mccallumonty, Kaylen Motl, MD    Family History Family History  Problem Relation Age of Onset  . Diabetes Mother   . Hypertension Mother   . Hypertension Father     Social History Social History   Tobacco Use  . Smoking status: Light Tobacco Smoker  . Smokeless tobacco: Never Used  Substance Use Topics  . Alcohol use: Yes  . Drug use: No     Allergies   Patient has no known allergies.   Review of Systems Review of Systems  Constitutional: Negative for chills, fatigue and fever.  HENT: Negative for sore throat.   Respiratory: Negative for cough and shortness of breath.   Cardiovascular: Negative for chest pain.  Gastrointestinal: Positive for abdominal pain, diarrhea, nausea and vomiting. Negative for anorexia, constipation, flatus, hematemesis, hematochezia and melena.  Genitourinary: Negative for dysuria, hematuria, vaginal bleeding and vaginal discharge.     Physical Exam Triage Vital Signs ED Triage Vitals  Enc Vitals Group     BP 06/15/17 1852 101/70     Pulse Rate 06/15/17 1852 86  Resp 06/15/17 1852 18     Temp 06/15/17 1852 98.2 F (36.8 C)     Temp Source 06/15/17 1852 Oral     SpO2 06/15/17 1852 99 %     Weight 06/15/17 1848 210 lb (95.3 kg)     Height 06/15/17 1848 5\' 11"  (1.803 m)     Head Circumference --      Peak Flow --      Pain Score 06/15/17 1848 6     Pain Loc --      Pain Edu? --      Excl. in GC? --    No data found.  Updated Vital Signs BP 101/70   Pulse 86   Temp 98.2 F (36.8 C) (Oral)   Resp 18   Ht 5\' 11"  (1.803 m)   Wt 210 lb (95.3 kg)   LMP  05/25/2017   SpO2 99%   BMI 29.29 kg/m   Visual Acuity Right Eye Distance:   Left Eye Distance:   Bilateral Distance:    Right Eye Near:   Left Eye Near:    Bilateral Near:     Physical Exam  Constitutional: She appears well-developed and well-nourished. No distress.  Abdominal: Soft. Bowel sounds are normal. She exhibits no distension and no mass. There is tenderness (mild, diffuse). There is no rebound and no guarding.  Skin: She is not diaphoretic.  Nursing note and vitals reviewed.    UC Treatments / Results  Labs (all labs ordered are listed, but only abnormal results are displayed) Labs Reviewed - No data to display  EKG  EKG Interpretation None       Radiology No results found.  Procedures Procedures (including critical care time)  Medications Ordered in UC Medications - No data to display   Initial Impression / Assessment and Plan / UC Course  I have reviewed the triage vital signs and the nursing notes.  Pertinent labs & imaging results that were available during my care of the patient were reviewed by me and considered in my medical decision making (see chart for details).       Final Clinical Impressions(s) / UC Diagnoses   Final diagnoses:  Viral gastroenteritis  Other acute gastritis without hemorrhage    ED Discharge Orders        Ordered    famotidine (PEPCID) 20 MG tablet  2 times daily     06/15/17 1910    ondansetron (ZOFRAN ODT) 8 MG disintegrating tablet  Every 8 hours PRN     06/15/17 1910     1. diagnosis reviewed with patient 2. rx as per orders above; reviewed possible side effects, interactions, risks and benefits  3. Recommend supportive treatment with clear liquids then advance diet slowly as tolerated  4. Follow-up prn if symptoms worsen or don't improve  Controlled Substance Prescriptions [place] Controlled Substance Registry consulted? Not Applicable   Payton Mccallum, MD 06/15/17 [phone]

## 2017-06-15 NOTE — ED Triage Notes (Signed)
Patient states she has been  having stomach pain for the past 2 days.

## 2017-12-29 ENCOUNTER — Encounter: Payer: Self-pay | Admitting: Emergency Medicine

## 2017-12-29 ENCOUNTER — Ambulatory Visit
Admission: EM | Admit: 2017-12-29 | Discharge: 2017-12-29 | Disposition: A | Discharge status: Home / Self Care | Payer: BLUE CROSS/BLUE SHIELD | Attending: Family Medicine | Admitting: Family Medicine

## 2017-12-29 ENCOUNTER — Other Ambulatory Visit: Payer: Self-pay

## 2017-12-29 DIAGNOSIS — R21 Rash and other nonspecific skin eruption: Secondary | ICD-10-CM

## 2017-12-29 DIAGNOSIS — K130 Diseases of lips: Secondary | ICD-10-CM | POA: Diagnosis not present

## 2017-12-29 DIAGNOSIS — R220 Localized swelling, mass and lump, head: Secondary | ICD-10-CM

## 2017-12-29 MED ORDER — HYDROXYZINE HCL 25 MG PO TABS: 25.0000 mg | ORAL_TABLET | Freq: Three times a day (TID) | ORAL | 0 refills | Status: DC | PRN

## 2017-12-29 MED ORDER — METHYLPREDNISOLONE SODIUM SUCC 125 MG IJ SOLR
125.0000 mg | Freq: Once | INTRAMUSCULAR | Status: AC
Start: 2017-12-29 — End: 2017-12-29
  Administered 2017-12-29: 125 mg via INTRAMUSCULAR

## 2017-12-29 MED ORDER — PREDNISONE 10 MG (21) PO TBPK: ORAL_TABLET | ORAL | 0 refills | Status: DC

## 2017-12-29 NOTE — Discharge Instructions (Signed)
Medications as prescribed.  Contact allergy -  Address: [address], [place], [place] [address]  Hours:  Phone: [phone]  Take care  Dr. Adriana Simas

## 2017-12-29 NOTE — ED Triage Notes (Signed)
Pt c/o facial swelling. Redness around her lips, painful around her lips. Started yesterday morning. Has regressively gotten worse. No SOB. She took benadryl yesterday when this first started and it did not help. She has had this in the past and and nothing she can think of that could have given her an allergic reaction.

## 2017-12-29 NOTE — ED Provider Notes (Signed)
[facility]    CSN: [id number] Arrival date & time: 12/29/17  1203  History   Chief Complaint Chief Complaint  Patient presents with  . Facial Swelling   HPI  27 year old female presents with the above complaint.  Patient reports that her symptoms started yesterday.  She reports upper lip swelling and facial redness.  No reports of tongue swelling or shortness of breath.  Patient states that she has had this 2 times previously while in the [place].  She states that it occurred in the same fashion.  She was treated once in the [place] with corticosteroids and had improvement.  Patient states that her swelling and redness seems to be worsening.  She has tried Benadryl without improvement.  Patient is adamant that this is not an allergic reaction.  Patient states that she has not come into contact with anything or changed anything.  Patient is distressed by her symptoms.  She is very concerned that she does not know the etiology of her symptoms.  Patient states that "I think it is viral or bacterial".  Patient states that it seems to occur when she has "too much bacteria in my mouth".  No other associated symptoms.  No other complaints.  PMH, Surgical Hx, Family Hx, Social History reviewed and updated as below.  Past Medical History:  Diagnosis Date  . GERD (gastroesophageal reflux disease) 2014  . PNA (pneumonia) 1998   Past Surgical History:  Procedure Laterality Date  . NO PAST SURGERIES     OB History   None    Home Medications    Prior to Admission medications   Medication Sig Start Date End Date Taking? Authorizing Provider  ranitidine (ZANTAC) 75 MG tablet Take by mouth.   Yes [provider]  hydrOXYzine (ATARAX/VISTARIL) 25 MG tablet Take 1 tablet (25 mg total) by mouth every 8 (eight) hours as needed. 12/29/17   Tommie Sams, DO  predniSONE (STERAPRED UNI-PAK 21 TAB) 10 MG (21) TBPK tablet 6 tablets on day 1; decrease by 1  tablet daily until gone. 12/29/17   Tommie Sams, DO   Family History Family History  Problem Relation Age of Onset  . Diabetes Mother   . Hypertension Mother   . Hypertension Father    Social History Social History   Tobacco Use  . Smoking status: Current Some Day Smoker  . Smokeless tobacco: Never Used  Substance Use Topics  . Alcohol use: Yes  . Drug use: No   Allergies   Patient has no known allergies.   Review of Systems Review of Systems  Respiratory: Negative.   Skin:       Facial redness, Upper lip swelling.   Physical Exam Triage Vital Signs ED Triage Vitals  Enc Vitals Group     BP 12/29/17 1219 128/85     Pulse Rate 12/29/17 1219 (!) 52     Resp 12/29/17 1219 16     Temp 12/29/17 1219 97.8 F (36.6 C)     Temp Source 12/29/17 1219 Oral     SpO2 12/29/17 1219 100 %     Weight 12/29/17 1217 209 lb (94.8 kg)     Height 12/29/17 1217 5\' 11"  (1.803 m)     Head Circumference --      Peak Flow --      Pain Score 12/29/17 1216 7     Pain Loc --      Pain Edu? --  Excl. in GC? --    Updated Vital Signs BP 128/85 (BP Location: Left Arm)   Pulse (!) 52   Temp 97.8 F (36.6 C) (Oral)   Resp 16   Ht 5\' 11"  (1.803 m)   Wt 94.8 kg   LMP 12/07/2017 (Approximate)   SpO2 100%   BMI 29.15 kg/m   Visual Acuity Right Eye Distance:   Left Eye Distance:   Bilateral Distance:    Right Eye Near:   Left Eye Near:    Bilateral Near:     Physical Exam  Constitutional: She is oriented to person, place, and time. She appears well-developed. No distress.  HENT:  Head: Normocephalic and atraumatic.  Upper lip swelling.  Oropharynx clear.  No tongue swelling. Mild facial erythema.  Cardiovascular: Normal rate and regular rhythm.  Pulmonary/Chest: Effort normal and breath sounds normal. She has no wheezes. She has no rales.  Neurological: She is alert and oriented to person, place, and time.  Psychiatric:  Anxious, defensive.  Nursing note and vitals  reviewed.  UC Treatments / Results  Labs (all labs ordered are listed, but only abnormal results are displayed) Labs Reviewed - No data to display  EKG None  Radiology No results found.  Procedures Procedures (including critical care time)  Medications Ordered in UC Medications  methylPREDNISolone sodium succinate (SOLU-MEDROL) 125 mg/2 mL injection 125 mg (125 mg Intramuscular Given 12/29/17 1319)    Initial Impression / Assessment and Plan / UC Course  I have reviewed the triage vital signs and the nursing notes.  Pertinent labs & imaging results that were available during my care of the patient were reviewed by me and considered in my medical decision making (see chart for details).    27 year old female presents with suspected allergic reaction.  Patient very defensive and adamant that this is not allergic in nature.  I advised her that this appears to to be contact or allergic.  No evidence of bacterial or viral etiology.  Treating with steroids and Atarax.  Injection given today at the patient's request.  Information given regarding allergy/immunology.  Final Clinical Impressions(s) / UC Diagnoses   Final diagnoses:  Facial swelling  Facial rash     Discharge Instructions     Medications as prescribed.  Contact allergy -  Address: [address], [place], [place] [address]  Hours:  Phone: [phone]  Take care  Dr. Adriana Simas    ED Prescriptions    Medication Sig Dispense Auth. Provider   predniSONE (STERAPRED UNI-PAK 21 TAB) 10 MG (21) TBPK tablet 6 tablets on day 1; decrease by 1 tablet daily until gone. 21 tablet Aja Bolander G, DO   hydrOXYzine (ATARAX/VISTARIL) 25 MG tablet Take 1 tablet (25 mg total) by mouth every 8 (eight) hours as needed. 30 tablet Tommie Sams, DO     Controlled Substance Prescriptions [place] Controlled Substance Registry consulted? Not Applicable   Tommie Sams, DO 12/29/17 1414

## 2018-01-31 ENCOUNTER — Encounter: Payer: Self-pay | Admitting: Gynecology

## 2018-01-31 ENCOUNTER — Ambulatory Visit
Admission: EM | Admit: 2018-01-31 | Discharge: 2018-01-31 | Disposition: A | Discharge status: Home / Self Care | Payer: BLUE CROSS/BLUE SHIELD | Attending: Emergency Medicine | Admitting: Emergency Medicine

## 2018-01-31 DIAGNOSIS — J0100 Acute maxillary sinusitis, unspecified: Secondary | ICD-10-CM | POA: Diagnosis not present

## 2018-01-31 DIAGNOSIS — J029 Acute pharyngitis, unspecified: Secondary | ICD-10-CM

## 2018-01-31 LAB — RAPID STREP SCREEN (MED CTR MEBANE ONLY): Streptococcus, Group A Screen (Direct): NEGATIVE

## 2018-01-31 MED ORDER — AMOXICILLIN-POT CLAVULANATE 875-125 MG PO TABS: 1.0000 | ORAL_TABLET | Freq: Two times a day (BID) | ORAL | 0 refills | Status: DC

## 2018-01-31 NOTE — ED Provider Notes (Signed)
[facility] ____________________________________________  Time seen: Approximately 11:21 AM  I have reviewed the triage vital signs and the nursing notes.   HISTORY  Chief Complaint No chief complaint on file.   HPI Robin Hayden is a 27 y.o. female reason for evaluation of sore throat and nasal congestion present for the last 1 week.  States having pressure to the front of her face with the congestion.  States sore throat currently mild to moderate.  Overall continues to eat and drink well.  Unresolved with over-the-counter congestion medications.  Denies known fevers.  States some sick contacts at work.  Denies known seasonal allergies.  Has continue to remain active.  Occasional cough.  Denies chest pain or shortness of breath, abdominal pain.  Denies recent sickness or recent antibiotic use.  Denies current pregnancy.  Care, [facility]: PCP Patient's last menstrual period was 01/10/2018.Denies pregnancy.    Past Medical History:  Diagnosis Date  . GERD (gastroesophageal reflux disease) 2014  . PNA (pneumonia) 1998    There are no active problems to display for this patient.   Past Surgical History:  Procedure Laterality Date  . NO PAST SURGERIES       No current facility-administered medications for this encounter.   Current Outpatient Medications:  .  hydrOXYzine (ATARAX/VISTARIL) 25 MG tablet, Take 1 tablet (25 mg total) by mouth every 8 (eight) hours as needed., Disp: 30 tablet, Rfl: 0 .  ranitidine (ZANTAC) 75 MG tablet, Take by mouth., Disp: , Rfl:  .  amoxicillin-clavulanate (AUGMENTIN) 875-125 MG tablet, Take 1 tablet by mouth every 12 (twelve) hours., Disp: 20 tablet, Rfl: 0  Allergies Patient has no known allergies.  Family History  Problem Relation Age of Onset  . Diabetes Mother   . Hypertension Mother   . Hypertension Father     Social History Social History   Tobacco Use  . Smoking status: Current Some Day Smoker  .  Smokeless tobacco: Never Used  Substance Use Topics  . Alcohol use: Yes  . Drug use: No    Review of Systems Constitutional: No fever ENT: As above.  Cardiovascular: Denies chest pain. Respiratory: Denies shortness of breath. Gastrointestinal: No abdominal pain.  No nausea, no vomiting.  No diarrhea.  No constipation. Genitourinary: Negative for dysuria. Musculoskeletal: Negative for back pain. Skin: Negative for rash. Neurological: Negative for headaches, focal weakness or numbness.  10-point ROS otherwise negative.  ____________________________________________   PHYSICAL EXAM:  VITAL SIGNS: ED Triage Vitals  Enc Vitals Group     BP 01/31/18 1022 111/87     Pulse Rate 01/31/18 1022 64     Resp 01/31/18 1022 16     Temp 01/31/18 1022 98.2 F (36.8 C)     Temp Source 01/31/18 1022 Oral     SpO2 01/31/18 1022 100 %     Weight 01/31/18 1024 210 lb (95.3 kg)     Height --      Head Circumference --      Peak Flow --      Pain Score 01/31/18 1024 7     Pain Loc --      Pain Edu? --      Excl. in GC? --     Constitutional: Alert and oriented. Well appearing and in no acute distress. Eyes: Conjunctivae are normal.  Head: Atraumatic.Minimal tenderness to palpation bilateral frontal and mild to moderate tenderness bilaterally maxillary sinuses. No swelling. No erythema.   Ears: no erythema, normal TMs bilaterally.  Nose: nasal congestion with bilateral nasal turbinate erythema and edema.   Mouth/Throat: Mucous membranes are moist. Mild pharyngeal erythema. No tonsillar swelling or exudate.  Neck: No stridor.  No cervical spine tenderness to palpation. Hematological/Lymphatic/Immunilogical: No cervical lymphadenopathy. Cardiovascular: Normal rate, regular rhythm. Grossly normal heart sounds.  Good peripheral circulation. Respiratory: Normal respiratory effort.  No retractions. No wheezes, rales or rhonchi. Good air movement.  Musculoskeletal:Steady gait.  Neurologic:   Normal speech and language. No gait instability. Skin:  Skin is warm, dry and intact. No rash noted. Psychiatric: Mood and affect are normal. Speech and behavior are normal.  ___________________________________________   LABS (all labs ordered are listed, but only abnormal results are displayed)  Labs Reviewed  RAPID STREP SCREEN ([facility] ONLY)  CULTURE, GROUP A STREP [facility])    PROCEDURES Procedures    INITIAL IMPRESSION / ASSESSMENT AND PLAN / ED COURSE  Pertinent labs & imaging results that were available during my care of the patient were reviewed by me and considered in my medical decision making (see chart for details).  Well-appearing patient.  No acute distress.  Suspect recent viral illness with secondary sinusitis and postnasal drainage pharyngitis.  Will treat with oral Augmentin.  Encourage over-the-counter cough and decongestions as needed.  Encourage rest, fluids, supportive care.Discussed indication, risks and benefits of medications with patient.  Discussed follow up with Primary care physician this week. Discussed follow up and return parameters including no resolution or any worsening concerns. Patient verbalized understanding and agreed to plan.   ____________________________________________   FINAL CLINICAL IMPRESSION(S) / ED DIAGNOSES  Final diagnoses:  Acute maxillary sinusitis, recurrence not specified  Acute pharyngitis, unspecified etiology     ED Discharge Orders         Ordered    amoxicillin-clavulanate (AUGMENTIN) 875-125 MG tablet  Every 12 hours     01/31/18 1122           Note: This dictation was prepared with Dragon dictation along with smaller phrase technology. Any transcriptional errors that result from this process are unintentional.         Renford Dills, NP 01/31/18 1414

## 2018-01-31 NOTE — Discharge Instructions (Addendum)
Take medication as prescribed. Rest. Drink plenty of fluids. Over the counter decongestants medication as needed.   Follow up with your primary care physician this week as needed. Return to Urgent care for new or worsening concerns.

## 2018-01-31 NOTE — ED Triage Notes (Signed)
Per patient c/o sore throat and congestion x 1 week ago.

## 2018-02-03 LAB — CULTURE, GROUP A STREP (THRC)

## 2018-02-11 ENCOUNTER — Encounter: Payer: Self-pay | Admitting: Certified Nurse Midwife

## 2018-03-08 ENCOUNTER — Encounter: Payer: Self-pay | Admitting: Certified Nurse Midwife

## 2018-03-08 ENCOUNTER — Other Ambulatory Visit (HOSPITAL_COMMUNITY)
Admission: RE | Admit: 2018-03-08 | Discharge: 2018-03-08 | Disposition: A | Discharge status: Home / Self Care | Payer: BLUE CROSS/BLUE SHIELD | Source: Ambulatory Visit | Attending: Certified Nurse Midwife | Admitting: Certified Nurse Midwife

## 2018-03-08 ENCOUNTER — Ambulatory Visit: Payer: BLUE CROSS/BLUE SHIELD | Admitting: Certified Nurse Midwife

## 2018-03-08 VITALS — BP 98/63 | HR 69 | Ht 71.0 in | Wt 220.4 lb

## 2018-03-08 DIAGNOSIS — R102 Pelvic and perineal pain: Secondary | ICD-10-CM | POA: Diagnosis present

## 2018-03-08 DIAGNOSIS — R1030 Lower abdominal pain, unspecified: Secondary | ICD-10-CM

## 2018-03-08 DIAGNOSIS — N898 Other specified noninflammatory disorders of vagina: Secondary | ICD-10-CM | POA: Diagnosis not present

## 2018-03-08 DIAGNOSIS — N941 Unspecified dyspareunia: Secondary | ICD-10-CM | POA: Insufficient documentation

## 2018-03-08 NOTE — Patient Instructions (Addendum)
Pelvic Pain, Female Pelvic pain is pain in your lower belly (abdomen), below your belly button and between your hips. The pain may start suddenly (acute), keep coming back (recurring), or last a long time (chronic). Pelvic pain that lasts longer than six months is considered chronic. There are many causes of pelvic pain. Sometimes the cause of your pelvic pain is not known. Follow these instructions at home:  Take over-the-counter and prescription medicines only as told by your doctor.  Rest as told by your doctor.  Do not have sex it if hurts.  Keep a journal of your pelvic pain. Write down: ? When the pain started. ? Where the pain is located. ? What seems to make the pain better or worse, such as food or your menstrual cycle. ? Any symptoms you have along with the pain.  Keep all follow-up visits as told by your doctor. This is important. Contact a doctor if:  Medicine does not help your pain.  Your pain comes back.  You have new symptoms.  You have unusual vaginal discharge or bleeding.  You have a fever or chills.  You are having a hard time pooping (constipation).  You have blood in your pee (urine) or poop (stool).  Your pee smells bad.  You feel weak or lightheaded. Get help right away if:  You have sudden pain that is very bad.  Your pain continues to get worse.  You have very bad pain and also have any of the following symptoms: ? A fever. ? Feeling stick to your stomach (nausea). ? Throwing up (vomiting). ? Being very sweaty.  You pass out (lose consciousness). This information is not intended to replace advice given to you by your health care provider. Make sure you discuss any questions you have with your health care provider. Document Released: 09/03/2007 Document Revised: 04/11/2015 Document Reviewed: 01/05/2015 Elsevier Interactive Patient Education  2018 [employer]  Vaginitis Vaginitis is an inflammation of the vagina. It can happen when the  normal bacteria and yeast in the vagina grow too much. There are different types. Treatment will depend on the type you have. Follow these instructions at home:  Take all medicines as told by your doctor.  Keep your vagina area clean and dry. Avoid soap. Rinse the area with water.  Avoid washing and cleaning out the vagina (douching).  Do not use tampons or have sex (intercourse) until your treatment is done.  Wipe from front to back after going to the restroom.  Wear cotton underwear.  Avoid wearing underwear while you sleep until your vaginitis is gone.  Avoid tight pants. Avoid underwear or nylons without a cotton panel.  Take off wet clothing (such as a bathing suit) as soon as you can.  Use mild, unscented products. Avoid fabric softeners and scented: ? Feminine sprays. ? Laundry detergents. ? Tampons. ? Soaps or bubble baths.  Practice safe sex and use condoms. Get help right away if:  You have belly (abdominal) pain.  You have a fever or lasting symptoms for more than 2-3 days.  You have a fever and your symptoms suddenly get worse. This information is not intended to replace advice given to you by your health care provider. Make sure you discuss any questions you have with your health care provider. Document Released: 06/13/2008 Document Revised: 08/23/2015 Document Reviewed: 08/28/2011 Elsevier Interactive Patient Education  2017 [employer] Inc.  Dyspareunia, Female Dyspareunia is pain that is associated with sexual activity. This can affect any part of  the genitals or lower abdomen, and there are many possible causes. This condition ranges from mild to severe. Depending on the cause, dyspareunia may get better with treatment, or it may return (recur) over time. What are the causes? The cause of this condition is not always known. Possible causes include:  Cancer.  Psychological factors, such as depression, anxiety, or previous traumatic experiences.  Severe  pain and tenderness of the skin around the vagina (vulva) when it is touched (vulvar vestibulitis syndrome).  Infection of the pelvis or the vulva.  Infection of the vagina.  Painful, involuntary tightening (contraction) of the vaginal muscles when anything is put inside the vagina (vaginismus).  Allergic reaction.  Ovarian cysts.  Solid growths of tissue (tumors) in the ovaries or the uterus.  Scar tissue in the ovaries, vagina, or pelvis.  Vaginal dryness.  Thinning of the tissue (atrophy) of the vulva and vagina.  Skin conditions that affect the vulva (vulvar dermatoses), such as lichen sclerosus or lichen planus.  Endometriosis.  Tubal pregnancy.  A tilted uterus.  Uterine prolapse.  Adhesions in the vagina.  Bladder problems.  Intestinal problems.  Certain medicines.  Medical conditions such as diabetes, arthritis, or thyroid disease.  What increases the risk? The following factors may make you more likely to develop this condition:  Having experienced physical or sexual trauma.  Having given birth more than once.  Taking birth control pills.  Having gone through menopause.  Having recently given birth, typically within the past 3-6 months.  Breastfeeding.  What are the signs or symptoms? The main symptom of this condition is pain in any part of the genitals or lower abdomen during or after sexual activity. This may include pain during sexual arousal, genital stimulation, or orgasm. Pain may get worse when anything is inserted into the vagina, or when the genitals are touched in any way, such as when sitting or wearing pants. Pain can range from mild to severe, depending on the cause of the condition. In some cases, symptoms go away with treatment and return (recur) at a later date. How is this diagnosed? This condition may be diagnosed based on:  Your symptoms, including: ? Where your pain is located. ? When your pain occurs.  Your medical  history.  A physical exam. This may include a pelvic exam and a Pap test. This is a screening test that is used to check for signs of cancer of the vagina, cervix, and uterus.  Tests, including: ? Blood tests. ? Ultrasound. This uses sound waves to make a picture of the area that is being tested. ? Urine culture. This test involves checking a urine sample for signs of infection. ? Culture test. This is when your health care provider uses a swab to collect a sample of vaginal fluid. The sample is checked for signs of infection. ? X-rays. ? MRI. ? CT scan. ? Laparoscopy. This is a procedure in which a small incision is made in your lower abdomen and a lighted, pencil-sized instrument (laparoscope) is passed through the incision and used to look inside your pelvis.  You may be referred to a health care provider who specializes in women's health (gynecologist). In some cases, diagnosing the cause of dyspareunia can be difficult. How is this treated? Treatment depends on the cause of your condition and your symptoms. In most cases, you may need to stop sexual activity until your symptoms improve. Treatment may include:  Lubricants.  Kegel exercises or vaginal dilators.  Medicated skin creams.  Medicated vaginal creams.  Hormonal therapy.  Antibiotic medicine to prevent or fight infection.  Medicines that help to relieve pain.  Medicines that treat depression (antidepressants).  Psychological counseling.  Sex therapy.  Surgery.  Follow these instructions at home: Lifestyle  Avoid tight clothing and irritating materials around your genital and abdominal area.  Use water-based lubricants as needed. Avoid oil-based lubricants.  Do not use any products that irritate you. This may include certain condoms, spermicides, lubricants, soaps, tampons, vaginal sprays, or douches.  Always practice safe sex. Talk with your health care provider about which form of birth control  (contraception) is best for you.  Maintain open communication with your sexual partner. General instructions  Take over-the-counter and prescription medicines only as told by your health care provider.  If you had tests done, it is your responsibility to get your tests results. Ask your health care provider or the department performing the test when your results will be ready.  Urinate before you engage in sexual activity.  Consider joining a support group.  Keep all follow-up visits as told by your health care provider. This is important. Contact a health care provider if:  You develop vaginal bleeding after sexual intercourse.  You develop a lump at the opening of your vagina. Seek medical care even if the lump is painless.  You have: ? Abnormal vaginal discharge. ? Vaginal dryness. ? Itchiness or irritation of your vulva or vagina. ? A new rash. ? Symptoms that get worse or do not improve with treatment. ? A fever. ? Pain when you urinate. ? Blood in your urine. Get help right away if:  You develop severe pain in your abdomen during or shortly after sexual intercourse.  You pass out after having sexual intercourse. This information is not intended to replace advice given to you by your health care provider. Make sure you discuss any questions you have with your health care provider. Document Released: 04/06/2007 Document Revised: 07/27/2015 Document Reviewed: 10/17/2014 Elsevier Interactive Patient Education  [employer].

## 2018-03-08 NOTE — Progress Notes (Signed)
GYN ENCOUNTER NOTE  Subjective:       Robin Hayden is a 27 y.o. G0P0000 female is here for gynecologic evaluation of the following issues:  1. Pelvic pain 2. Lower abdominal pain 3. Vaginal odor 4. Dyspareunia 5. Vaginal discharge-whitish yellow, increased amount  Reports intermittent symptoms for the last one (1) to two (2) months. Has not attempted any home treatment measures.   Denies difficulty breathing or respiratory distress, chest pain, dysuria, and leg pain or swelling.    Gynecologic History  Patient's last menstrual period was 02/14/2018 (exact date). Period Cycle (Days): 28 Period Duration (Days): 5-6 Period Pattern: Regular Menstrual Flow: Heavy Menstrual Control: Maxi pad, Panty liner Dysmenorrhea: (!) Severe Dysmenorrhea Symptoms: Cramping, Other (Comment), Nausea, Diarrhea, Headache  Contraception: condoms  Last Pap: 07/20/2017. Results were: Negative/Negative  Obstetric History  OB History  Gravida Para Term Preterm AB Living  0 0 0 0 0 0  SAB TAB Ectopic Multiple Live Births  0 0 0 0 0    Past Medical History:  Diagnosis Date  . GERD (gastroesophageal reflux disease) 2014  . PNA (pneumonia) 1998  . Seizure [facility])     Past Surgical History:  Procedure Laterality Date  . NO PAST SURGERIES      Current Outpatient Medications on File Prior to Visit  Medication Sig Dispense Refill  . famotidine (PEPCID) 10 MG tablet Take 10 mg by mouth 2 (two) times daily.    Marland Kitchen triamcinolone (KENALOG) 0.025 % ointment Apply topically.     No current facility-administered medications on file prior to visit.     No Known Allergies  Social History   Socioeconomic History  . Marital status: Single    Spouse name: Not on file  . Number of children: Not on file  . Years of education: Not on file  . Highest education level: Not on file  Occupational History  . Not on file  Social Needs  . Financial resource strain: Not on file  . Food  insecurity:    Worry: Not on file    Inability: Not on file  . Transportation needs:    Medical: Not on file    Non-medical: Not on file  Tobacco Use  . Smoking status: Former Games developer  . Smokeless tobacco: Never Used  Substance and Sexual Activity  . Alcohol use: Yes  . Drug use: No  . Sexual activity: Yes    Birth control/protection: Condom  Lifestyle  . Physical activity:    Days per week: Not on file    Minutes per session: Not on file  . Stress: Not on file  Relationships  . Social connections:    Talks on phone: Not on file    Gets together: Not on file    Attends religious service: Not on file    Active member of club or organization: Not on file    Attends meetings of clubs or organizations: Not on file    Relationship status: Not on file  . Intimate partner violence:    Fear of current or ex partner: Not on file    Emotionally abused: Not on file    Physically abused: Not on file    Forced sexual activity: Not on file  Other Topics Concern  . Not on file  Social History Narrative  . Not on file    Family History  Problem Relation Age of Onset  . Diabetes Mother   . Hypertension Mother   . Hypertension Father  The following portions of the patient's history were reviewed and updated as appropriate: allergies, current medications, past family history, past medical history, past social history, past surgical history and problem list.  Review of Systems  ROS negative except as noted above. Information obtained from patient.   Objective:   BP 98/63   Pulse 69   Ht 5\' 11"  (1.803 m)   Wt 220 lb 6 oz (100 kg)   LMP 02/14/2018 (Exact Date)   BMI 30.74 kg/m    CONSTITUTIONAL: Well-developed, well-nourished female in no acute distress.   ABDOMEN: Soft, non distended; Non tender.  No Organomegaly.  PELVIC:  External Genitalia: Normal  Vagina: Yellow, clearish white discharge present  Cervix: Normal  Uterus: Normal size, shape,consistency,  mobile  Adnexa: Normal   MUSCULOSKELETAL: Normal range of motion. No tenderness.  No cyanosis, clubbing, or edema.  Assessment:   1. Pelvic pain  - Urine Culture - Cervicovaginal ancillary only  2. Lower abdominal pain  - Urine Culture  3. Vaginal odor  - Cervicovaginal ancillary only  4. Dyspareunia in female  - Cervicovaginal ancillary only  5. Vaginal discharge  - Cervicovaginal ancillary only   Plan:   Labs: See orders, will contact patient with results  Discussed home treatment measures, see ABS  Reviewed red flag symptoms and when to call  RTC x 1 week for ultrasound and follow up results review   Gunnar BullaJenkins Michelle Donovan Gatchel, CNM [facility], [facility] 03/08/18 3:32 PM

## 2018-03-09 LAB — CERVICOVAGINAL ANCILLARY ONLY
Bacterial vaginitis: POSITIVE — AB
Candida vaginitis: NEGATIVE
Chlamydia: NEGATIVE
Neisseria Gonorrhea: NEGATIVE
Trichomonas: NEGATIVE

## 2018-03-18 ENCOUNTER — Other Ambulatory Visit: Payer: Self-pay | Admitting: Obstetrics and Gynecology

## 2018-03-18 ENCOUNTER — Other Ambulatory Visit: Payer: BLUE CROSS/BLUE SHIELD

## 2018-03-18 ENCOUNTER — Encounter: Payer: Self-pay | Admitting: Emergency Medicine

## 2018-03-18 ENCOUNTER — Ambulatory Visit
Admission: EM | Admit: 2018-03-18 | Discharge: 2018-03-18 | Disposition: A | Discharge status: Home / Self Care | Payer: BLUE CROSS/BLUE SHIELD | Attending: Family Medicine | Admitting: Family Medicine

## 2018-03-18 ENCOUNTER — Other Ambulatory Visit: Payer: Self-pay

## 2018-03-18 ENCOUNTER — Telehealth: Payer: Self-pay | Admitting: Certified Nurse Midwife

## 2018-03-18 ENCOUNTER — Other Ambulatory Visit: Payer: BLUE CROSS/BLUE SHIELD | Admitting: Certified Nurse Midwife

## 2018-03-18 DIAGNOSIS — R102 Pelvic and perineal pain: Secondary | ICD-10-CM

## 2018-03-18 DIAGNOSIS — J988 Other specified respiratory disorders: Secondary | ICD-10-CM | POA: Insufficient documentation

## 2018-03-18 LAB — RAPID INFLUENZA A&B ANTIGENS
Influenza A (ARMC): NEGATIVE
Influenza B (ARMC): NEGATIVE

## 2018-03-18 MED ORDER — BENZONATATE 100 MG PO CAPS: 100.0000 mg | ORAL_CAPSULE | Freq: Three times a day (TID) | ORAL | 0 refills | Status: DC | PRN

## 2018-03-18 MED ORDER — CETIRIZINE-PSEUDOEPHEDRINE ER 5-120 MG PO TB12: 1.0000 | ORAL_TABLET | Freq: Two times a day (BID) | ORAL | 0 refills | Status: DC

## 2018-03-18 MED ORDER — AMOXICILLIN-POT CLAVULANATE 875-125 MG PO TABS: 1.0000 | ORAL_TABLET | Freq: Two times a day (BID) | ORAL | 0 refills | Status: DC

## 2018-03-18 NOTE — ED Triage Notes (Signed)
Patient c/o cough and congestion, facial pain and pressure and fever that started 3 days ago. Patient has taken Mucinex DM for her symptoms.

## 2018-03-18 NOTE — ED Provider Notes (Signed)
[facility]    CSN: [id number] Arrival date & time: 03/18/18  1637  History   Chief Complaint Chief Complaint  Patient presents with  . Cough  . Facial Pain  . Nasal Congestion   HPI  27 year old female presents with the above complaints.  No feeling well for the past 3 days.  Patient reports that it started with a scratchy throat.  This seems to have resolved.  Now she is bothered by cough which is productive as well as sinus pain and pressure.  Cough is productive particular in the morning.  She reports sinus pressure and pain.  Associated body aches.  No documented fever although she reports subjective fever.  She denies sick contacts.  She has taken Mucinex without improvement.  No other reported symptoms.  No other complaints.  PMH, Surgical Hx, Family Hx, Social History reviewed and updated as below.  Past Medical History:  Diagnosis Date  . GERD (gastroesophageal reflux disease) 2014  . PNA (pneumonia) 1998  . Seizure [facility])    Past Surgical History:  Procedure Laterality Date  . NO PAST SURGERIES     OB History    Gravida  0   Para  0   Term  0   Preterm  0   AB  0   Living  0     SAB  0   TAB  0   Ectopic  0   Multiple  0   Live Births  0          Home Medications    Prior to Admission medications   Medication Sig Start Date End Date Taking? Authorizing Provider  famotidine (PEPCID) 10 MG tablet Take 10 mg by mouth 2 (two) times daily.   Yes [provider]  amoxicillin-clavulanate (AUGMENTIN) 875-125 MG tablet Take 1 tablet by mouth every 12 (twelve) hours. 03/18/18   Tommie Samsook, Angles Trevizo G, DO  benzonatate (TESSALON) 100 MG capsule Take 1 capsule (100 mg total) by mouth 3 (three) times daily as needed. 03/18/18   Tommie Samsook, Amiracle Neises G, DO  cetirizine-pseudoephedrine (ZYRTEC-D) 5-120 MG tablet Take 1 tablet by mouth 2 (two) times daily. 03/18/18   Tommie Samsook, Kannon Granderson G, DO    Family History Family History  Problem Relation Age of Onset  .  Diabetes Mother   . Hypertension Mother   . Hypertension Father     Social History Social History   Tobacco Use  . Smoking status: Former Games developermoker  . Smokeless tobacco: Never Used  Substance Use Topics  . Alcohol use: Yes  . Drug use: No     Allergies   Patient has no known allergies.   Review of Systems Review of Systems  HENT: Positive for congestion, sinus pressure, sinus pain and sore throat.   Respiratory: Positive for cough.   Musculoskeletal:       Bodyaches.   Physical Exam Triage Vital Signs ED Triage Vitals  Enc Vitals Group     BP 03/18/18 1648 112/80     Pulse Rate 03/18/18 1648 72     Resp 03/18/18 1648 18     Temp 03/18/18 1648 98 F (36.7 C)     Temp Source 03/18/18 1648 Oral     SpO2 03/18/18 1648 100 %     Weight 03/18/18 1650 210 lb (95.3 kg)     Height 03/18/18 1650 5\' 11"  (1.803 m)     Head Circumference --      Peak Flow --  Pain Score 03/18/18 1648 8     Pain Loc --      Pain Edu? --      Excl. in GC? --    Updated Vital Signs BP 112/80 (BP Location: Left Arm)   Pulse 72   Temp 98 F (36.7 C) (Oral)   Resp 18   Ht 5\' 11"  (1.803 m)   Wt 95.3 kg   LMP 02/18/2018   SpO2 100%   BMI 29.29 kg/m   Visual Acuity Right Eye Distance:   Left Eye Distance:   Bilateral Distance:    Right Eye Near:   Left Eye Near:    Bilateral Near:     Physical Exam Vitals signs and nursing note reviewed.  Constitutional:      General: She is not in acute distress. HENT:     Head: Normocephalic and atraumatic.     Right Ear: Tympanic membrane normal.     Left Ear: Tympanic membrane normal.  Eyes:     General:        Right eye: No discharge.        Left eye: No discharge.     Conjunctiva/sclera: Conjunctivae normal.  Cardiovascular:     Rate and Rhythm: Normal rate and regular rhythm.  Pulmonary:     Effort: Pulmonary effort is normal. No respiratory distress.     Breath sounds: No wheezing, rhonchi or rales.  Neurological:     Mental  Status: She is alert.  Psychiatric:        Mood and Affect: Mood normal.        Thought Content: Thought content normal.    UC Treatments / Results  Labs (all labs ordered are listed, but only abnormal results are displayed) Labs Reviewed  RAPID INFLUENZA A&B ANTIGENS ([facility] ONLY)    EKG None  Radiology No results found.  Procedures Procedures (including critical care time)  Medications Ordered in UC Medications - No data to display  Initial Impression / Assessment and Plan / UC Course  I have reviewed the triage vital signs and the nursing notes.  Pertinent labs & imaging results that were available during my care of the patient were reviewed by me and considered in my medical decision making (see chart for details).    27 year old female presents with a respiratory infection.  I suspect that this is viral given the duration of her symptoms.  I informed the patient of this.  Patient has a history of sinusitis and seems to be concerned about this.  Treating with Tessalon Perles and Zyrtec-D.  Wait-and-see antibiotic given (to be filled if she fails to improve worsens; delayed antibiotic).  Final Clinical Impressions(s) / UC Diagnoses   Final diagnoses:  Respiratory infection     Discharge Instructions     Likely viral.  Use the medications as directed.  If you worsen or fail to improve, start the antibiotic.  Merry Christmas  Dr. Adriana Simas    ED Prescriptions    Medication Sig Dispense Auth. Provider   amoxicillin-clavulanate (AUGMENTIN) 875-125 MG tablet Take 1 tablet by mouth every 12 (twelve) hours. 14 tablet Kahleel Fadeley G, DO   cetirizine-pseudoephedrine (ZYRTEC-D) 5-120 MG tablet Take 1 tablet by mouth 2 (two) times daily. 30 tablet Candas Deemer G, DO   benzonatate (TESSALON) 100 MG capsule Take 1 capsule (100 mg total) by mouth 3 (three) times daily as needed. 30 capsule Tommie Sams, DO     Controlled Substance Prescriptions [place] Controlled Substance  Registry  consulted? Not Applicable   Tommie SamsCook, Ecko Beasley G, DO 03/18/18 1756

## 2018-03-18 NOTE — Telephone Encounter (Signed)
Patient called and cancelled appointments for today due to being sick. She also stated she is no longer having ovary pain and does not think she needs to come in. She is not able to access her mychart at this time. She would like a call back just to confirm that she doesn't need to do anything else. Thanks

## 2018-03-18 NOTE — Discharge Instructions (Signed)
Likely viral.  Use the medications as directed.  If you worsen or fail to improve, start the antibiotic.  Merry Christmas  Dr. Adriana Simasook

## 2018-03-19 NOTE — Telephone Encounter (Signed)
Attempted to contact patient, mailbox is ful, unable to leave message.  Will try again later. Need to notify patient of swab results.

## 2018-03-22 NOTE — Telephone Encounter (Signed)
Mailbox is full, unable to leave voicemail. Will try again later

## 2018-03-30 NOTE — Telephone Encounter (Signed)
LMTRC

## 2018-03-30 NOTE — Telephone Encounter (Signed)
Letter sent to address on file.  Patient needs to be aware of vaginal swab results.  Chart to be filed.

## 2018-05-18 ENCOUNTER — Telehealth: Payer: Self-pay

## 2018-05-18 NOTE — Telephone Encounter (Signed)
Letter mailed by DM on 03/30/2018 was returned. Undeliverable.

## 2018-07-14 IMAGING — CT CT ABD-PELV W/ CM
2 of 4 series · 16 of 46 positions shown, 18 images · IV contrast (iopamidol)
Comparison: None

CLINICAL DATA: Abdominal cramping and body aches since 8088 hours
this morning, nausea, vomiting and diarrhea for 3 days, history GERD

EXAM:
CT ABDOMEN AND PELVIS WITH CONTRAST
TECHNIQUE: Multidetector CT imaging of the abdomen and pelvis was performed
using the standard protocol following bolus administration of
intravenous contrast. Sagittal and coronal MPR images reconstructed
from axial data set.
CONTRAST:  100mL DS4LXE-DYY IOPAMIDOL (DS4LXE-DYY) INJECTION 61% 100
cc [id number] IV

[Series 2: routine abd/pel with · axial · 0.97mm/px · z∈[-1057,-572]mm · 13 of 107 slices shown, 15 images]
[im 5/107  soft-tissue]
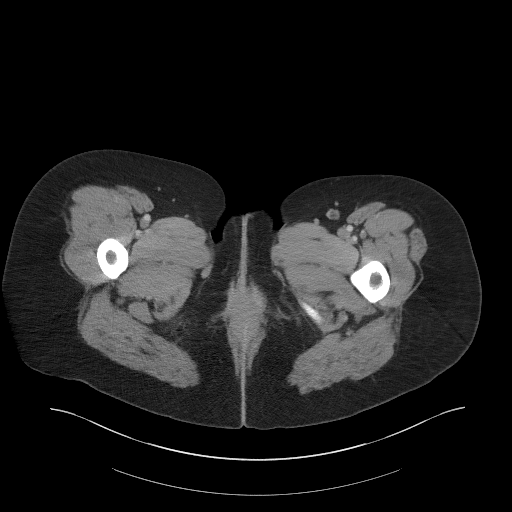
[im 5/107  bone]
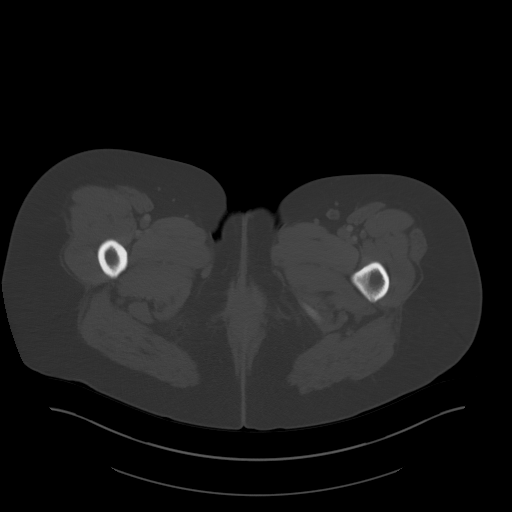
[im 13/107  soft-tissue]
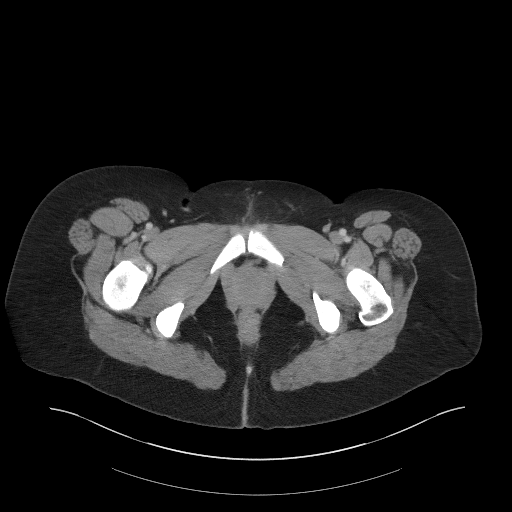
[im 22/107  soft-tissue]
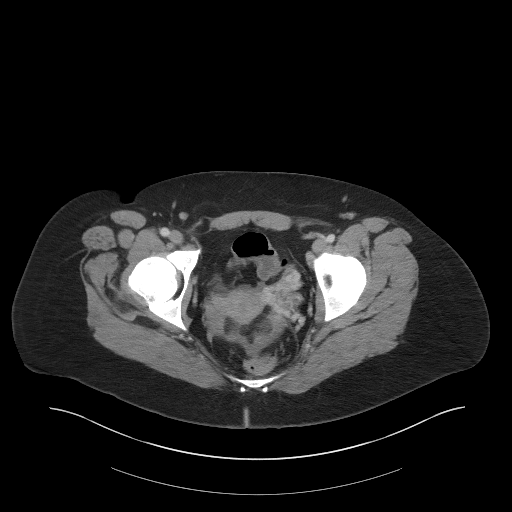
[im 30/107  soft-tissue]
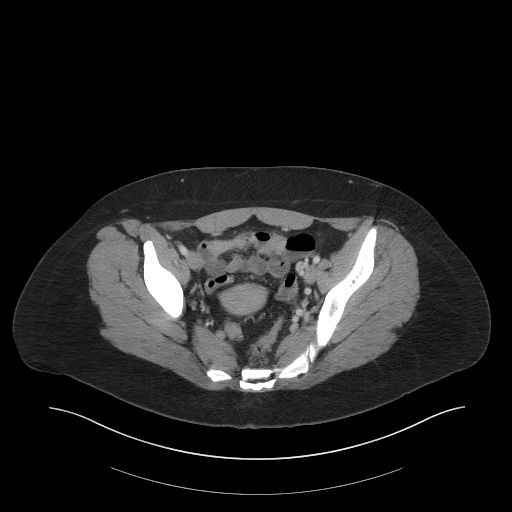
[im 39/107  soft-tissue]
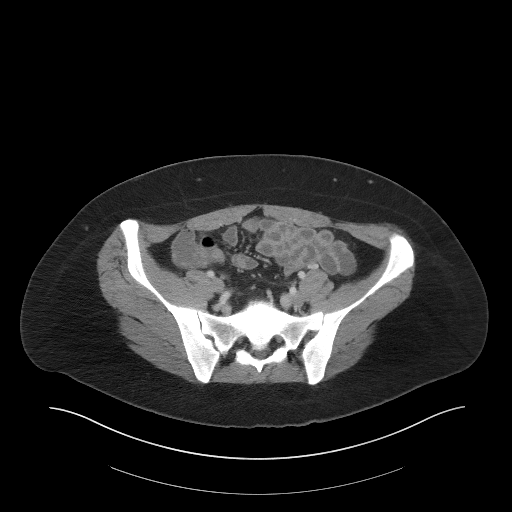
[im 47/107  soft-tissue]
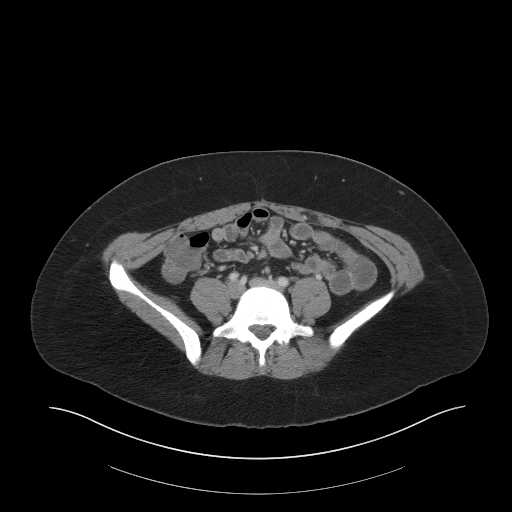
[im 56/107  soft-tissue]
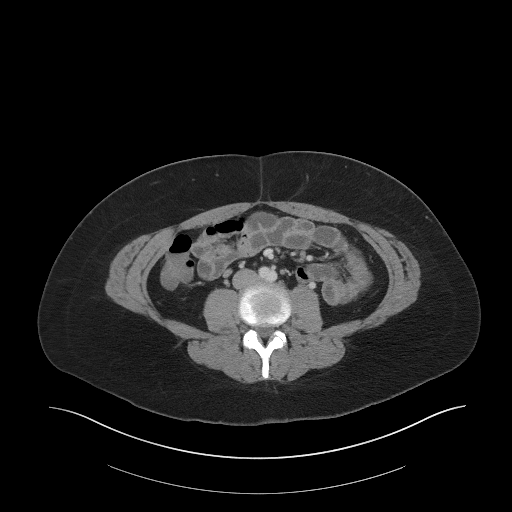
[im 60/107  soft-tissue]
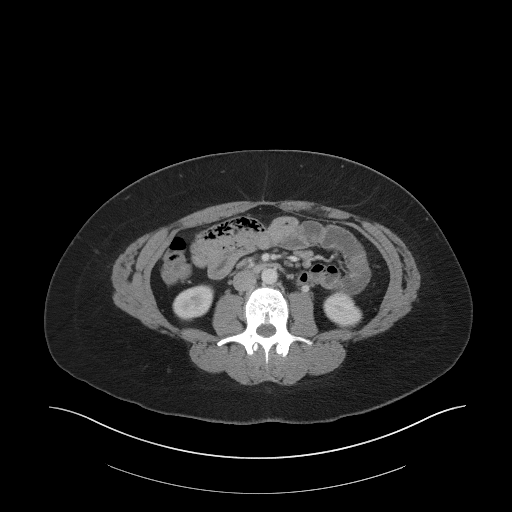
[im 68/107  soft-tissue]
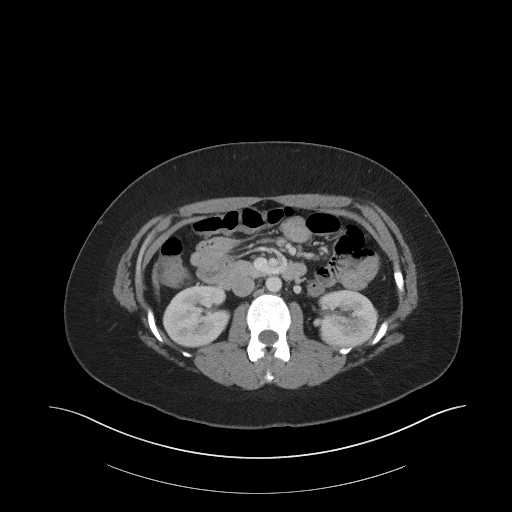
[im 68/107  bone]
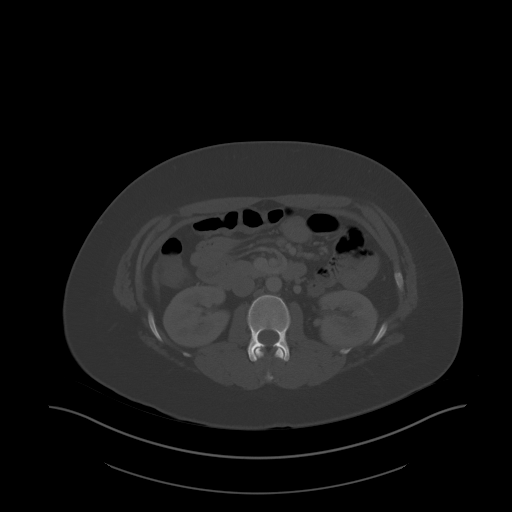
[im 77/107  soft-tissue]
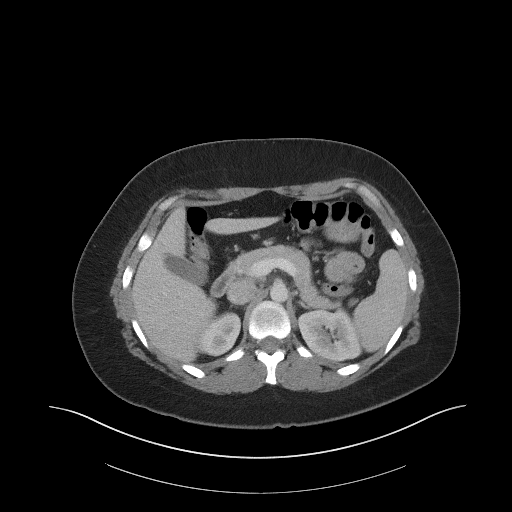
[im 85/107  soft-tissue]
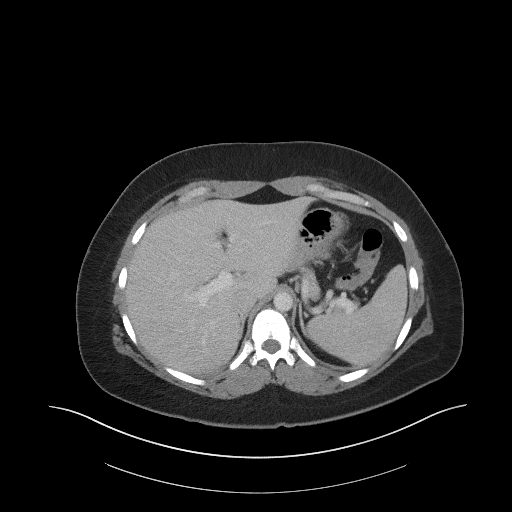
[im 94/107  soft-tissue]
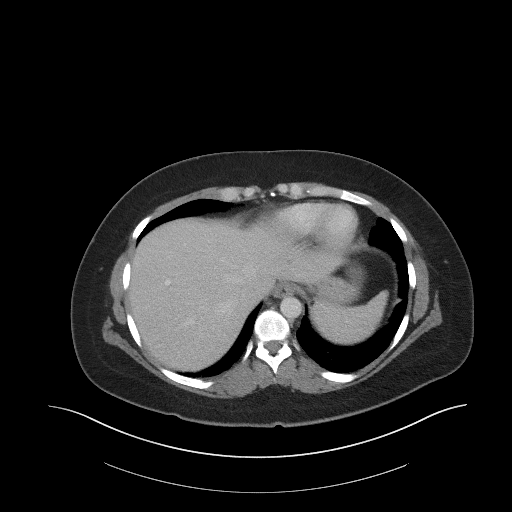
[im 102/107  soft-tissue]
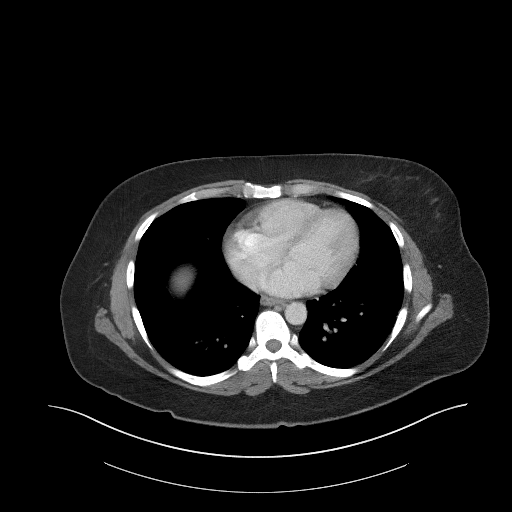

[Series 5: coronal st · coronal · 0.76mm/px · 3 of 89 slices shown]
[im 30/89  soft-tissue]
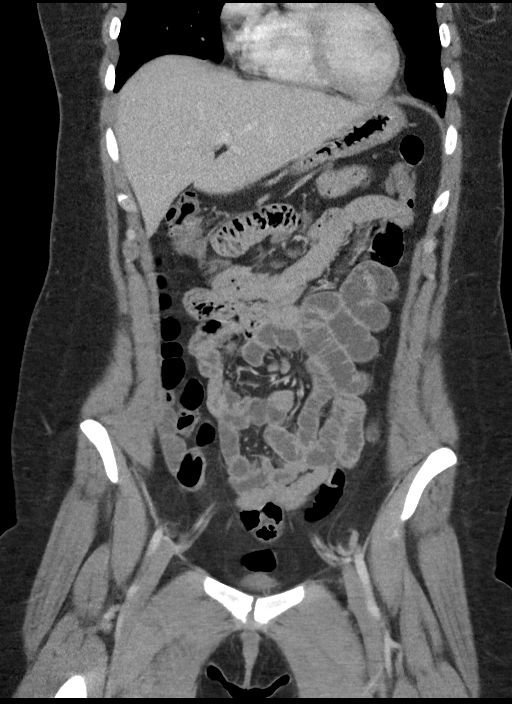
[im 40/89  soft-tissue]
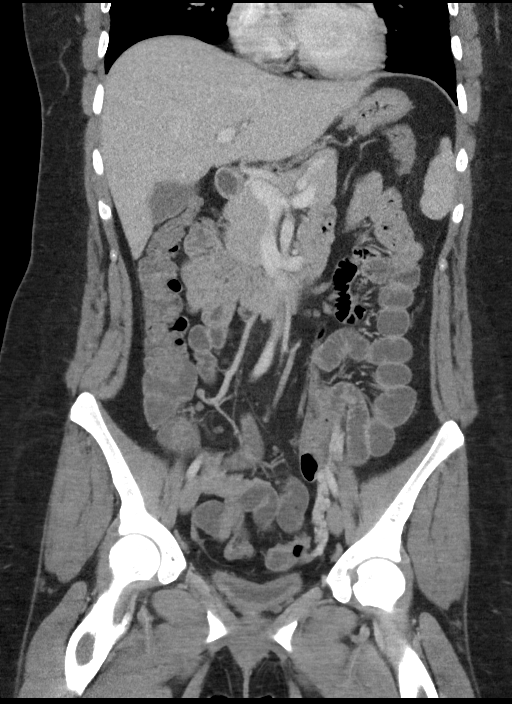
[im 49/89  soft-tissue]
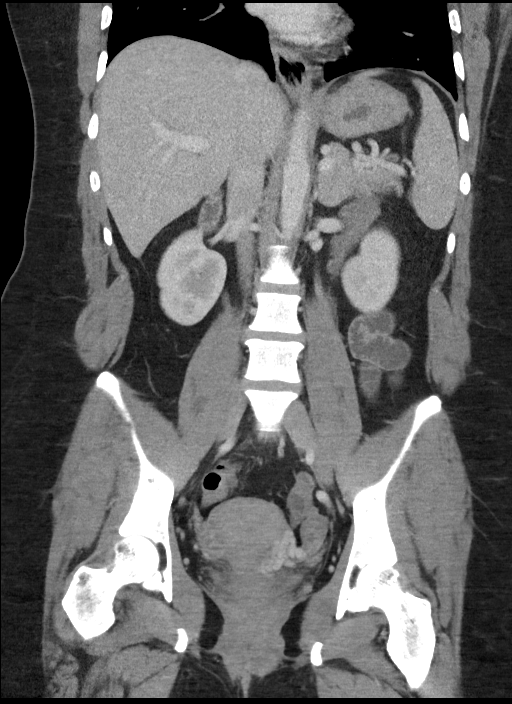

[16 of 46 positions shown; findings below may reference images not displayed]

FINDINGS: Lower chest: Linear scarring LEFT lower lobe.

Hepatobiliary: Gallbladder and liver normal appearance

Pancreas: Normal appearance

Spleen: Normal appearance

Adrenals/Urinary Tract: Duplication of the LEFT renal collecting
system and proximal LEFT ureter. Adrenal glands, kidneys, ureters,
and bladder otherwise normal appearance

Stomach/Bowel: Normal appendix. Stomach decompressed. Bowel loops
unremarkable.

Vascular/Lymphatic: Unremarkable

Reproductive: Uterus and ovaries unremarkable

Other: Minimal free pelvic fluid, potentially physiologic. No
significant ascites. No free air. No hernia.

Musculoskeletal: Osseous structures unremarkable.
IMPRESSION: No acute intra-abdominal or intrapelvic abnormalities.

## 2018-10-10 ENCOUNTER — Ambulatory Visit
Admission: EM | Admit: 2018-10-10 | Discharge: 2018-10-10 | Disposition: A | Discharge status: Home / Self Care | Payer: BLUE CROSS/BLUE SHIELD | Attending: Urgent Care | Admitting: Urgent Care

## 2018-10-10 ENCOUNTER — Other Ambulatory Visit: Payer: Self-pay

## 2018-10-10 ENCOUNTER — Encounter: Payer: Self-pay | Admitting: Emergency Medicine

## 2018-10-10 DIAGNOSIS — L259 Unspecified contact dermatitis, unspecified cause: Secondary | ICD-10-CM

## 2018-10-10 MED ORDER — TRIAMCINOLONE ACETONIDE 0.1 % EX CREA: 1.0000 "application " | TOPICAL_CREAM | Freq: Two times a day (BID) | CUTANEOUS | 0 refills | Status: AC

## 2018-10-10 MED ORDER — DEXAMETHASONE SODIUM PHOSPHATE 10 MG/ML IJ SOLN
10.0000 mg | Freq: Once | INTRAMUSCULAR | Status: AC
Start: 2018-10-10 — End: 2018-10-10
  Administered 2018-10-10: 10 mg via INTRAMUSCULAR

## 2018-10-10 NOTE — ED Provider Notes (Signed)
[place], [place]   Name: Robin Hayden DOB: 01-08-1991 MRN: [id number] CSN: [id number] PCP: [facility] [facility]  Arrival date and time:  10/10/18 1023  Chief Complaint:  Rash and Facial Swelling   NOTE: Prior to seeing the patient today, I have reviewed the triage nursing documentation and vital signs. Clinical staff has updated patient's PMH/PSHx, current medication list, and drug allergies/intolerances to ensure comprehensive history available to assist in medical decision making.   History:   HPI: Robin Hayden is a 28 y.o. female who presents today with complaints of rash to her face, LEFT upper chest, and LEFT hand that initially declared 3 days ago. Rash started on her LEFT hand and has progressively spread to the aforementioned locations.    Patient advising that she was working outside and inadvertently came into contact with some sort of allergen. She is not able to recall direct contact with poison oak/ivy/sumac. Rash is erythematous and pruritic. She has not appreciated any drainage associated with the rash. There is facial involvement with (+) swelling noted. Patient has areas of rash under her RIGHT eye and to her LEFT cheek. Patient denies that she is experiencing any shortness of breath or sensation of pharyngeal/laryngeal fullness.   In efforts to help reduce/relieve the associated pruritis, the patient advising that she has used topical diphenhydramine and topical triamcinolone at home. Patient notes that use of theses interventions have proven to be ineffective overall.   Past Medical History:  Diagnosis Date  . GERD (gastroesophageal reflux disease) 2014  . PNA (pneumonia) 1998  . Seizure [facility](HCC)     Past Surgical History:  Procedure Laterality Date  . NO PAST SURGERIES      Family History  Problem Relation Age of Onset  . Diabetes Mother   . Hypertension Mother   . Hypertension Father     Social History   Tobacco Use  . Smoking status:  Current Some Day Smoker    Types: Cigarettes  . Smokeless tobacco: Never Used  Substance Use Topics  . Alcohol use: Yes  . Drug use: No    There are no active problems to display for this patient.   Home Medications:    No outpatient medications have been marked as taking for the 10/10/18 encounter [facility](Hospital Encounter).    Allergies:   Patient has no known allergies.  Review of Systems (ROS): Review of Systems  Constitutional: Negative for chills and fever.  HENT: Negative for drooling and trouble swallowing.   Eyes: Negative for pain, discharge, redness and visual disturbance.  Respiratory: Negative for cough, chest tightness, shortness of breath and wheezing.   Cardiovascular: Negative for chest pain and palpitations.  Gastrointestinal: Negative for nausea and vomiting.  Skin: Positive for color change and rash.  Neurological: Negative for dizziness, syncope, weakness, numbness and headaches.     Vital Signs: Today's Vitals   10/10/18 1041 10/10/18 1045 10/10/18 1119  BP:  116/83   Pulse:  64   Resp:  16   Temp:  97.7 F (36.5 C)   TempSrc:  Oral   SpO2:  99%   Weight: 220 lb (99.8 kg)    Height: 5\' 11"  (1.803 m)    PainSc: 7   0-No pain    Physical Exam: Physical Exam  Constitutional: She is oriented to person, place, and time and well-developed, well-nourished, and in no distress.  HENT:  Head: Normocephalic and atraumatic.  Right Ear: External ear normal.  Mouth/Throat: Uvula is midline and mucous membranes  are normal. No posterior oropharyngeal edema or posterior oropharyngeal erythema.  Eyes: Pupils are equal, round, and reactive to light. Conjunctivae and EOM are normal. Right eye exhibits no discharge. Left eye exhibits no discharge.  Neck: Normal range of motion. No tracheal deviation present.  Cardiovascular: Normal rate, regular rhythm, normal heart sounds and intact distal pulses. Exam reveals no gallop and no friction rub.  No murmur heard.  Pulmonary/Chest: Effort normal and breath sounds normal. No respiratory distress. She has no wheezes. She has no rales.  Lymphadenopathy:    She has no cervical adenopathy.  Neurological: She is alert and oriented to person, place, and time. Gait normal. GCS score is 15.  Skin: Skin is warm and dry. Rash noted. Rash is maculopapular.  Areas of pruritic rash under RIGHT eye, to LEFT cheek, and scattered about LEFT anterior chest wall. Rash erythematous and scaly.   Psychiatric: Mood, memory, affect and judgment normal.  Nursing note and vitals reviewed.   Urgent Care Treatments / Results:   LABS: PLEASE NOTE: all labs that were ordered this encounter are listed, however only abnormal results are displayed. Labs Reviewed - No data to display  EKG: -None  RADIOLOGY: No results found.  PROCEDURES: Procedures  MEDICATIONS RECEIVED THIS VISIT: Medications  dexamethasone (DECADRON) injection 10 mg (10 mg Intramuscular Given 10/10/18 1116)    PERTINENT CLINICAL COURSE NOTES/UPDATES:   Initial Impression / Assessment and Plan / Urgent Care Course:  Pertinent labs & imaging results that were available during my care of the patient were personally reviewed by me and considered in my medical decision making (see lab/imaging section of note for values and interpretations).  Robin Hayden is a 28 y.o. female who presents to [facility] today with complaints of Rash and Facial Swelling  Patient overall well appearing and in no acute distress today in clinic. Exam reveals areas of rash to face and anterior chest wall. Rash erythematous and pruritic. Works outside a lot, however unable to identify a direct contact exposure. No shortness of breath or dysphagia; no concern for airway involvement. She is using topical diphenhydramine at home. Will treat in clinic with IM dexamethasone. Patient refusing continued steroid taper (prednisone) at home citing that it made her family  members "develop a mental illness" and "eating problems". Discussed commonly associated side effects seen with use of steroids, those of which include polyphagia and anxiety. Patient aware that IM medication that she received in clinic today could cause the same thing. She is adamant that she will not accept prednisone. She states, "it is my right to refuse it". She has been using low potency TAC (0.025%). Will increase strength to 0.1% and advise her to continue use BID PRN. Patient may continue both oral and/or topical H1 blocker (diphenhydramine) as needed for added symptomatic relief. Encouraged cool compresses to help sooth skin. Patient advised to avoid scratching to prevent worsening and transmission. She was given the clinic contact information and asked to return a call to [place] later today if not improving.  Discussed follow up with primary care physician in 1 week for re-evaluation. I have reviewed the follow up and strict return precautions for any new or worsening symptoms. Patient is aware of symptoms that would be deemed urgent/emergent, and would thus require further evaluation either here or in the emergency department. At the time of discharge, she verbalized understanding and consent with the discharge plan as it was reviewed with her. All questions were fielded by provider and/or clinic  staff prior to patient discharge.    Final Clinical Impressions / Urgent Care Diagnoses:   Final diagnoses:  Contact dermatitis, unspecified contact dermatitis type, unspecified trigger    New Prescriptions:  [place] Controlled Substance Registry consulted? Not Applicable  Meds ordered this encounter  Medications  . dexamethasone (DECADRON) injection 10 mg  . triamcinolone cream (KENALOG) 0.1 %    Sig: Apply 1 application topically 2 (two) times daily.    Dispense:  30 g    Refill:  0    Recommended Follow up Care:  Patient encouraged to follow up with the following provider within the specified  time frame, or sooner as dictated by the severity of her symptoms. As always, she was instructed that for any urgent/emergent care needs, she should seek care either here or in the emergency department for more immediate evaluation.  Follow-up Information    [facility] [place] [facility] In 1 week.   Specialty: Family Medicine Why: General reassessment of symptoms if not improving Contact information: [address] [place] [address] [phone]         NOTE: This note was prepared using Dragon dictation software along with smaller phrase technology. Despite my best ability to proofread, there is the potential that transcriptional errors may still occur from this process, and are completely unintentional.     Verlee MonteGray, Manraj Yeo E, NP 10/11/18 0301

## 2018-10-10 NOTE — ED Triage Notes (Signed)
Patient c/o itchy rash on her face and hand that started 3 days ago.  Patient reports some swelling in her face.  Patient denies fevers.

## 2018-10-10 NOTE — Discharge Instructions (Signed)
It was very nice seeing you today in clinic. Thank you for entrusting me with your care.   AVOID scratching. Cool compresses may help soothe skin.  -use triamcinolone cream -use benadryl cream -may use oral Benadryl as well  Make arrangements to follow up with your regular doctor in 1 week for re-evaluation if not improving. If your symptoms/condition worsens, please seek follow up care either here or in the ER. Please remember, our [facility] providers are "right here with you" when you need [place].   Again, it was my pleasure to take care of you today. Thank you for choosing our clinic. I hope that you start to feel better quickly.   Honor Loh, MSN, APRN, FNP-C, CEN Advanced Practice Provider [facility]

## 2019-07-16 ENCOUNTER — Ambulatory Visit: Payer: Self-pay

## 2019-07-28 ENCOUNTER — Ambulatory Visit: Payer: Self-pay | Attending: Internal Medicine

## 2019-07-28 DIAGNOSIS — Z20822 Contact with and (suspected) exposure to covid-19: Secondary | ICD-10-CM | POA: Insufficient documentation

## 2019-07-29 LAB — NOVEL CORONAVIRUS, NAA: SARS-CoV-2, NAA: NOT DETECTED

## 2019-07-29 LAB — SARS-COV-2, NAA 2 DAY TAT

## 2020-02-29 ENCOUNTER — Ambulatory Visit
Admission: EM | Admit: 2020-02-29 | Discharge: 2020-02-29 | Disposition: A | Discharge status: Other Acute Inpatient Hospital | Payer: Self-pay | Attending: Emergency Medicine | Admitting: Emergency Medicine

## 2020-02-29 ENCOUNTER — Other Ambulatory Visit: Payer: Self-pay

## 2020-02-29 ENCOUNTER — Encounter: Payer: Self-pay | Admitting: Emergency Medicine

## 2020-02-29 DIAGNOSIS — R1031 Right lower quadrant pain: Secondary | ICD-10-CM | POA: Insufficient documentation

## 2020-02-29 LAB — URINALYSIS, COMPLETE (UACMP) WITH MICROSCOPIC
Bilirubin Urine: NEGATIVE
Glucose, UA: NEGATIVE mg/dL
Hgb urine dipstick: NEGATIVE
Ketones, ur: NEGATIVE mg/dL
Leukocytes,Ua: NEGATIVE
Nitrite: NEGATIVE
Protein, ur: NEGATIVE mg/dL
RBC / HPF: NONE SEEN RBC/hpf (ref 0–5)
Specific Gravity, Urine: >1.03 — ABNORMAL HIGH (ref 1.005–1.030)
pH: 5.5 (ref 5.0–8.0)

## 2020-02-29 LAB — PREGNANCY, URINE: Preg Test, Ur: NEGATIVE

## 2020-02-29 MED ORDER — KETOROLAC TROMETHAMINE 30 MG/ML IJ SOLN
30.0000 mg | Freq: Once | INTRAMUSCULAR | Status: AC
  Administered 2020-02-29: 30 mg via INTRAMUSCULAR

## 2020-02-29 NOTE — ED Provider Notes (Signed)
HPI  SUBJECTIVE:  Robin Hayden is a 29 y.o. female who presents with 2 days of nonmigratory, nonradiating periumbilical sharp pain that lasts seconds to minutes that has now migrated to her right lower quadrant.  She reports nausea, dizziness, anorexia.  She reports pain with movement and walking around.  States that the pain got worse today.  No vomiting, fevers, abdominal distention.  She had a loose bowel movement earlier today which did not change her pain.  No change in her physical activity, trauma to the abdomen.  No recent heavy workout.  No urinary complaints.  No vaginal odor, bleeding, discharge, genital rash.  She states the pain is waking her up at night.  She states that the car ride over here was painful.  No antipyretic in the past 6 hours.  She is in a long-term monogamous relationship with a female who is asymptomatic, STDs are not a concern today.  She has not tried anything for this.  No alleviating factors.  Symptoms are worse with turning over in bed, walking, movement, and using her lower abdomen.  She has never had symptoms like this before.  Past medical history negative for PCOS, ovarian torsion, TOA, abdominal surgeries, pancreatitis, excessive alcohol intake, ovarian cyst, diabetes, hypertension, PID, ectopic pregnancy.  LMP: 3 weeks ago.  She states that she sometimes gets cramping before menses but states that this pain is completely different from that.  PMD: Dr. Ysidro Evert primary care    Past Medical History:  Diagnosis Date   GERD (gastroesophageal reflux disease) 2014   PNA (pneumonia) 1998   Seizure (HCC)     Past Surgical History:  Procedure Laterality Date   NO PAST SURGERIES      Family History  Problem Relation Age of Onset   Diabetes Mother    Hypertension Mother    Hypertension Father     Social History   Tobacco Use   Smoking status: Current Some Day Smoker    Types: Cigarettes   Smokeless tobacco: Never Used  Vaping Use    Vaping Use: Former  Substance Use Topics   Alcohol use: Yes   Drug use: No    No current facility-administered medications for this encounter.  Current Outpatient Medications:    famotidine (PEPCID) 10 MG tablet, Take 10 mg by mouth 2 (two) times daily., Disp: , Rfl:    triamcinolone cream (KENALOG) 0.1 %, Apply 1 application topically 2 (two) times daily., Disp: 30 g, Rfl: 0  No Known Allergies   ROS  As noted in HPI.   Physical Exam  BP 110/66 (BP Location: Left Arm)    Pulse 62    Temp 98.2 F (36.8 C) (Oral)    Resp 18    Ht 5\' 11"  (1.803 m)    Wt 99.8 kg    LMP 02/08/2020 (Approximate)    SpO2 100%    BMI 30.69 kg/m   Constitutional: Well developed, well nourished, appears uncomfortable Eyes:  EOMI, conjunctiva normal bilaterally HENT: Normocephalic, atraumatic,mucus membranes moist Respiratory: Normal inspiratory effort Cardiovascular: Normal rate GI: Normal appearance.  Soft.  Positive exquisite right lower quadrant tenderness.   Mild periumbilical tenderness. Hypoactive bowel sounds.  Negative Murphy, no other tenderness.  No rebound, guarding.  Negative obturator.  Negative Rovsing, negative tap table test.   Back: No CVAT  skin: No rash, skin intact Musculoskeletal: no deformities Neurologic: Alert & oriented x 3, no focal neuro deficits Psychiatric: Speech and behavior appropriate   ED Course  Medications  ketorolac (TORADOL) 30 MG/ML injection 30 mg (30 mg Intramuscular Given 02/29/20 1840)    Orders Placed This Encounter  Procedures   Urinalysis, Complete w Microscopic    Standing Status:   Standing    Number of Occurrences:   1   Pregnancy, urine    Standing Status:   Standing    Number of Occurrences:   1    Results for orders placed or performed during the hospital encounter of 02/29/20 (from the past 24 hour(s))  Urinalysis, Complete w Microscopic Urine, Clean Catch     Status: Abnormal   Collection Time: 02/29/20  5:36 PM  Result  Value Ref Range   Color, Urine YELLOW YELLOW   APPearance CLEAR CLEAR   Specific Gravity, Urine >1.030 (H) 1.005 - 1.030   pH 5.5 5.0 - 8.0   Glucose, UA NEGATIVE NEGATIVE mg/dL   Hgb urine dipstick NEGATIVE NEGATIVE   Bilirubin Urine NEGATIVE NEGATIVE   Ketones, ur NEGATIVE NEGATIVE mg/dL   Protein, ur NEGATIVE NEGATIVE mg/dL   Nitrite NEGATIVE NEGATIVE   Leukocytes,Ua NEGATIVE NEGATIVE   Squamous Epithelial / LPF 6-10 0 - 5   WBC, UA 0-5 0 - 5 WBC/hpf   RBC / HPF NONE SEEN 0 - 5 RBC/hpf   Bacteria, UA RARE (A) NONE SEEN  Pregnancy, urine     Status: None   Collection Time: 02/29/20  5:37 PM  Result Value Ref Range   Preg Test, Ur NEGATIVE NEGATIVE   No results found.  ED Clinical Impression  1. Right lower quadrant abdominal pain      ED Assessment/Plan  Primary concern is appendicitis.  In the differential is ovarian cyst, colitis.  Doubt PID.  She is not pregnant.  Doubt TOA or ovarian torsion.  She does not have any blood in her urine consistent with nephrolithiasis.  She does not have a UTI.  Feel that she needs further imaging that is beyond the scope of this urgent care.  Transferring to the [facility] emergency department.  I feel that she is stable to go by private vehicle.  Giving Toradol 30 mg IM x1 here.  Advised her to not to have anything to eat or drink until her ER evaluation is complete.  Patient agrees to go.  Meds ordered this encounter  Medications   ketorolac (TORADOL) 30 MG/ML injection 30 mg    *This clinic note was created using Scientist, clinical (histocompatibility and immunogenetics). Therefore, there may be occasional mistakes despite careful proofreading.   ?    Domenick Gong, MD 02/29/20 [phone]

## 2020-02-29 NOTE — ED Triage Notes (Signed)
Patient is being discharged from the Urgent Care and sent to the Emergency Department via POV. Per Dr. Chaney Malling, patient is in need of higher level of care due to possible appendicitis. Patient is aware and verbalizes understanding of plan of care.  Vitals:   02/29/20 1735  BP: 110/66  Pulse: 62  Resp: 18  Temp: 98.2 F (36.8 C)  SpO2: 100%

## 2020-02-29 NOTE — Discharge Instructions (Addendum)
Go to [facility] ED. Do not have anything to eat or drink until ER evaluation is complete. I am giving you toradol here.  I am concerned that you have appendicitis.

## 2020-02-29 NOTE — ED Triage Notes (Signed)
Pt c/o RLQ abdominal pain, nausea. Started about 3 days ago. She states she does not want to eat but pain is nor worse with food.

## 2020-07-04 ENCOUNTER — Other Ambulatory Visit: Payer: Self-pay

## 2020-07-04 ENCOUNTER — Ambulatory Visit
Admission: RE | Admit: 2020-07-04 | Discharge: 2020-07-04 | Disposition: A | Discharge status: Home / Self Care | Payer: BC Managed Care – PPO | Source: Ambulatory Visit | Attending: Physician Assistant | Admitting: Physician Assistant

## 2020-07-04 VITALS — BP 116/82 | HR 67 | Temp 98.8°F | Resp 18

## 2020-07-04 DIAGNOSIS — R0981 Nasal congestion: Secondary | ICD-10-CM | POA: Insufficient documentation

## 2020-07-04 DIAGNOSIS — B349 Viral infection, unspecified: Secondary | ICD-10-CM | POA: Diagnosis not present

## 2020-07-04 DIAGNOSIS — R519 Headache, unspecified: Secondary | ICD-10-CM | POA: Insufficient documentation

## 2020-07-04 DIAGNOSIS — Z87891 Personal history of nicotine dependence: Secondary | ICD-10-CM | POA: Insufficient documentation

## 2020-07-04 DIAGNOSIS — Z20822 Contact with and (suspected) exposure to covid-19: Secondary | ICD-10-CM | POA: Diagnosis not present

## 2020-07-04 DIAGNOSIS — Z79899 Other long term (current) drug therapy: Secondary | ICD-10-CM | POA: Diagnosis not present

## 2020-07-04 DIAGNOSIS — R059 Cough, unspecified: Secondary | ICD-10-CM | POA: Insufficient documentation

## 2020-07-04 DIAGNOSIS — M791 Myalgia, unspecified site: Secondary | ICD-10-CM | POA: Diagnosis not present

## 2020-07-04 MED ORDER — IBUPROFEN 800 MG PO TABS: 800.0000 mg | ORAL_TABLET | Freq: Three times a day (TID) | ORAL | 0 refills | Status: AC | PRN

## 2020-07-04 MED ORDER — IPRATROPIUM BROMIDE 0.06 % NA SOLN: 2.0000 | Freq: Four times a day (QID) | NASAL | 12 refills | Status: AC

## 2020-07-04 NOTE — ED Triage Notes (Signed)
Pt reports scratchy throat and nasal congestion x 3-4 days ago as well as body aches.  Developed into significant pressure in sinus.  Has cough productive for small amounts of greenish brown mucous.   Tried Mucinex today as well as Benadryl and Zyrtec.  Also took Ibuprofen.  All have provided a small amount of relief.

## 2020-07-04 NOTE — Discharge Instructions (Addendum)

## 2020-07-04 NOTE — ED Provider Notes (Signed)
[facility]    CSN: [id number] Arrival date & time: 07/04/20  1451      History   Chief Complaint No chief complaint on file.   HPI Robin Hayden is a 30 y.o. female presenting for 4-day history of scratchy throat, nasal congestion, and dry cough that has become productive of yellowish-green sputum.  Also admits to body aches and headaches.  Patient's been taking Mucinex, Benadryl and Zyrtec as well as ibuprofen and states that those medications have improved symptoms little bit.  She denies any fever, fatigue, chest pain, breathing difficulty, abdominal pain, nausea/vomiting or diarrhea.  She says that her friend is sick but her symptoms are thought to be due to allergies.  No known exposure to influenza or COVID-19.  Patient has no other complaints or concerns.  HPI  Past Medical History:  Diagnosis Date  . GERD (gastroesophageal reflux disease) 2014  . PNA (pneumonia) 1998  . Seizure (HCC)     There are no problems to display for this patient.   Past Surgical History:  Procedure Laterality Date  . NO PAST SURGERIES      OB History    Gravida  0   Para  0   Term  0   Preterm  0   AB  0   Living  0     SAB  0   IAB  0   Ectopic  0   Multiple  0   Live Births  0            Home Medications    Prior to Admission medications   Medication Sig Start Date End Date Taking? Authorizing Provider  DULoxetine (CYMBALTA) 60 MG capsule Take 60 mg by mouth daily. Pt has been taking 1/2 tablet once per day   Yes [provider]  ibuprofen (ADVIL) 800 MG tablet Take 1 tablet (800 mg total) by mouth every 8 (eight) hours as needed for up to 7 days. 07/04/20 07/11/20 Yes Eusebio Friendly B, PA-C  ipratropium (ATROVENT) 0.06 % nasal spray Place 2 sprays into both nostrils 4 (four) times daily. 07/04/20  Yes Eusebio Friendly B, PA-C  triamcinolone cream (KENALOG) 0.1 % Apply 1 application topically 2 (two) times daily. 10/10/18  Yes Verlee Monte,  NP  famotidine (PEPCID) 10 MG tablet Take 10 mg by mouth 2 (two) times daily.    [provider]    Family History Family History  Problem Relation Age of Onset  . Diabetes Mother   . Hypertension Mother   . Hypertension Father     Social History Social History   Tobacco Use  . Smoking status: Former Smoker    Types: Cigarettes  . Smokeless tobacco: Never Used  Vaping Use  . Vaping Use: Some days  . Substances: Nicotine, Flavoring  Substance Use Topics  . Alcohol use: Yes  . Drug use: No     Allergies   Patient has no known allergies.   Review of Systems Review of Systems  Constitutional: Negative for chills, diaphoresis, fatigue and fever.  HENT: Positive for congestion, postnasal drip and rhinorrhea. Negative for ear pain, sinus pressure, sinus pain and sore throat.   Respiratory: Positive for cough. Negative for shortness of breath.   Cardiovascular: Negative for chest pain.  Gastrointestinal: Negative for abdominal pain, nausea and vomiting.  Musculoskeletal: Positive for myalgias. Negative for arthralgias.  Skin: Negative for rash.  Neurological: Positive for headaches. Negative for weakness.  Hematological: Negative for adenopathy.  Physical Exam Triage Vital Signs ED Triage Vitals [07/04/20 1507]  Enc Vitals Group     BP 116/82     Pulse Rate 67     Resp 18     Temp 98.8 F (37.1 C)     Temp Source Oral     SpO2 98 %     Weight      Height      Head Circumference      Peak Flow      Pain Score 7     Pain Loc      Pain Edu?      Excl. in [place]?    No data found.  Updated Vital Signs BP 116/82 (BP Location: Left Arm)   Pulse 67   Temp 98.8 F (37.1 C) (Oral)   Resp 18   LMP 06/20/2020   SpO2 98%       Physical Exam Vitals and nursing note reviewed.  Constitutional:      General: She is not in acute distress.    Appearance: Normal appearance. She is ill-appearing. She is not toxic-appearing or diaphoretic.  HENT:      Head: Normocephalic and atraumatic.     Right Ear: Tympanic membrane, ear canal and external ear normal.     Left Ear: Tympanic membrane, ear canal and external ear normal.     Nose: Congestion and rhinorrhea present.     Mouth/Throat:     Mouth: Mucous membranes are moist.     Pharynx: Oropharynx is clear. No posterior oropharyngeal erythema.  Eyes:     General: No scleral icterus.       Right eye: No discharge.        Left eye: No discharge.     Conjunctiva/sclera: Conjunctivae normal.  Cardiovascular:     Rate and Rhythm: Normal rate and regular rhythm.     Heart sounds: Normal heart sounds.  Pulmonary:     Effort: Pulmonary effort is normal. No respiratory distress.     Breath sounds: Normal breath sounds. No wheezing, rhonchi or rales.  Musculoskeletal:     Cervical back: Neck supple.  Skin:    General: Skin is dry.  Neurological:     General: No focal deficit present.     Mental Status: She is alert. Mental status is at baseline.     Motor: No weakness.     Gait: Gait normal.  Psychiatric:        Mood and Affect: Mood normal.        Behavior: Behavior normal.        Thought Content: Thought content normal.      UC Treatments / Results  Labs (all labs ordered are listed, but only abnormal results are displayed) Labs Reviewed  SARS CORONAVIRUS 2 (TAT 6-24 HRS)    EKG   Radiology No results found.  Procedures Procedures (including critical care time)  Medications Ordered in UC Medications - No data to display  Initial Impression / Assessment and Plan / UC Course  I have reviewed the triage vital signs and the nursing notes.  Pertinent labs & imaging results that were available during my care of the patient were reviewed by me and considered in my medical decision making (see chart for details).   30 year old female presenting for 4-day history of cough and congestion.  All vital signs are normal and stable.  She is ill-appearing but nontoxic on exam.   Exam positive for congestion and rhinorrhea.  Chest is clear  to auscultation and heart regular rate and rhythm.  Covid is obtained.  Current CDC balance, isolation protocol and ED precautions reviewed with patient.  Advised her that she likely has a viral illness.  Encouraged increasing rest and fluids.  Sent 800 mg ibuprofen for headaches and body aches.  Also sent Atrovent nasal spray.  Advised her to follow-up with her department for any worsening symptoms or if not better after 7 to 10 days.  ED precautions reviewed. Work note given.   Final Clinical Impressions(s) / UC Diagnoses   Final diagnoses:  Viral illness  Cough  Nasal congestion  Acute nonintractable headache, unspecified headache type  Myalgia     Discharge Instructions     URI/COLD SYMPTOMS: Your exam today is consistent with a viral illness. Antibiotics are not indicated at this time. Use medications as directed, including cough syrup, nasal saline, and decongestants. Your symptoms should improve over the next few days and resolve within 7-10 days. Increase rest and fluids. F/u if symptoms worsen or predominate such as sore throat, ear pain, productive cough, shortness of breath, or if you develop high fevers or worsening fatigue over the next several days.    You have received COVID testing today either for positive exposure, concerning symptoms that could be related to COVID infection, screening purposes, or re-testing after confirmed positive.  Your test obtained today checks for active viral infection in the last 1-2 weeks. If your test is negative now, you can still test positive later. So, if you do develop symptoms you should either get re-tested and/or isolate x 5 days and then strict mask use x 5 days (unvaccinated) or mask use x 10 days (vaccinated). Please follow CDC guidelines.  While Rapid antigen tests come back in 15-20 minutes, send out PCR/molecular test results typically come back within 1-3 days. In the mean  time, if you are symptomatic, assume this could be a positive test and treat/monitor yourself as if you do have COVID.   We will call with test results if positive. Please download the MyChart app and set up a profile to access test results.   If symptomatic, go home and rest. Push fluids. Take Tylenol as needed for discomfort. Gargle warm salt water. Throat lozenges. Take Mucinex DM or Robitussin for cough. Humidifier in bedroom to ease coughing. Warm showers. Also review the COVID handout for more information.  COVID-19 INFECTION: The incubation period of COVID-19 is approximately 14 days after exposure, with most symptoms developing in roughly 4-5 days. Symptoms may range in severity from mild to critically severe. Roughly 80% of those infected will have mild symptoms. People of any age may become infected with COVID-19 and have the ability to transmit the virus. The most common symptoms include: fever, fatigue, cough, body aches, headaches, sore throat, nasal congestion, shortness of breath, nausea, vomiting, diarrhea, changes in smell and/or taste.    COURSE OF ILLNESS Some patients may begin with mild disease which can progress quickly into critical symptoms. If your symptoms are worsening please call ahead to the Emergency Department and proceed there for further treatment. Recovery time appears to be roughly 1-2 weeks for mild symptoms and 3-6 weeks for severe disease.   GO IMMEDIATELY TO ER FOR FEVER YOU ARE UNABLE TO GET DOWN WITH TYLENOL, BREATHING PROBLEMS, CHEST PAIN, FATIGUE, LETHARGY, INABILITY TO EAT OR DRINK, ETC  QUARANTINE AND ISOLATION: To help decrease the spread of COVID-19 please remain isolated if you have COVID infection or are highly suspected to have  COVID infection. This means -stay home and isolate to one room in the home if you live with others. Do not share a bed or bathroom with others while ill, sanitize and wipe down all countertops and keep common areas clean and  disinfected. Stay home for 5 days. If you have no symptoms or your symptoms are resolving after 5 days, you can leave your house. Continue to wear a mask around others for 5 additional days. If you have been in close contact (within 6 feet) of someone diagnosed with COVID 19, you are advised to quarantine in your home for 14 days as symptoms can develop anywhere from 2-14 days after exposure to the virus. If you develop symptoms, you  must isolate.  Most current guidelines for COVID after exposure -unvaccinated: isolate 5 days and strict mask use x 5 days. Test on day 5 is possible -vaccinated: wear mask x 10 days if symptoms do not develop -You do not necessarily need to be tested for COVID if you have + exposure and  develop symptoms. Just isolate at home x10 days from symptom onset During this global pandemic, CDC advises to practice social distancing, try to stay at least 606ft away from others at all times. Wear a face covering. Wash and sanitize your hands regularly and avoid going anywhere that is not necessary.  KEEP IN MIND THAT THE COVID TEST IS NOT 100% ACCURATE AND YOU SHOULD STILL DO EVERYTHING TO PREVENT POTENTIAL SPREAD OF VIRUS TO OTHERS (WEAR MASK, WEAR GLOVES, WASH HANDS AND SANITIZE REGULARLY). IF INITIAL TEST IS NEGATIVE, THIS MAY NOT MEAN YOU ARE DEFINITELY NEGATIVE. MOST ACCURATE TESTING IS DONE 5-7 DAYS AFTER EXPOSURE.   It is not advised by CDC to get re-tested after receiving a positive COVID test since you can still test positive for weeks to months after you have already cleared the virus.   *If you have not been vaccinated for COVID, I strongly suggest you consider getting vaccinated as long as there are no contraindications.      ED Prescriptions    Medication Sig Dispense Auth. Provider   ibuprofen (ADVIL) 800 MG tablet Take 1 tablet (800 mg total) by mouth every 8 (eight) hours as needed for up to 7 days. 21 tablet Eusebio FriendlyEaves, Tonea Leiphart B, PA-C   ipratropium (ATROVENT) 0.06 %  nasal spray Place 2 sprays into both nostrils 4 (four) times daily. 15 mL Shirlee LatchEaves, Marilynne Dupuis B, PA-C     PDMP not reviewed this encounter.   Shirlee Latchaves, Lashawndra Lampkins B, PA-C 07/04/20 1557

## 2020-07-05 LAB — SARS CORONAVIRUS 2 (TAT 6-24 HRS): SARS Coronavirus 2: NEGATIVE

## 2022-06-11 ENCOUNTER — Emergency Department: Payer: Self-pay

## 2022-06-11 ENCOUNTER — Emergency Department
Admission: EM | Admit: 2022-06-11 | Discharge: 2022-06-11 | Disposition: A | Discharge status: Home / Self Care | Payer: Self-pay | Attending: Emergency Medicine | Admitting: Emergency Medicine

## 2022-06-11 DIAGNOSIS — S01511A Laceration without foreign body of lip, initial encounter: Secondary | ICD-10-CM | POA: Insufficient documentation

## 2022-06-11 DIAGNOSIS — S80211A Abrasion, right knee, initial encounter: Secondary | ICD-10-CM | POA: Insufficient documentation

## 2022-06-11 DIAGNOSIS — Y92410 Unspecified street and highway as the place of occurrence of the external cause: Secondary | ICD-10-CM | POA: Diagnosis not present

## 2022-06-11 DIAGNOSIS — S0083XA Contusion of other part of head, initial encounter: Secondary | ICD-10-CM | POA: Insufficient documentation

## 2022-06-11 HISTORY — DX: Anxiety disorder, unspecified: F41.9

## 2022-06-11 MED ORDER — LIDO-EPINEPHRINE-TETRACAINE 4-0.05-0.5 % EX GEL
3.0000 mL | Freq: Once | CUTANEOUS | Status: AC
  Administered 2022-06-11: 3 mL via TOPICAL
  Filled 2022-06-11: qty 3
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
## 2022-06-11 NOTE — Discharge Instructions (Addendum)
The suture in your lip cut will absorb on its own.  The CAT scan of your head and neck and x-rays of your right knee and right ankle did not show any injuries.  However if you develop new pain on your body that you are concerned about please return to the emergency department.  You likely have a concussion.  Please rest and avoid screen time for about 1 week.  Please see your physician in about 7 days for repeat evaluation.
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
## 2023-01-23 ENCOUNTER — Emergency Department
Admission: EM | Admit: 2023-01-23 | Discharge: 2023-01-24 | Disposition: A | Discharge status: Home / Self Care | Payer: Self-pay | Attending: Emergency Medicine | Admitting: Emergency Medicine

## 2023-01-23 ENCOUNTER — Other Ambulatory Visit: Payer: Self-pay

## 2023-01-23 ENCOUNTER — Emergency Department: Payer: Self-pay

## 2023-01-23 DIAGNOSIS — S0182XA Laceration with foreign body of other part of head, initial encounter: Secondary | ICD-10-CM | POA: Insufficient documentation

## 2023-01-23 DIAGNOSIS — S0181XA Laceration without foreign body of other part of head, initial encounter: Secondary | ICD-10-CM

## 2023-01-23 DIAGNOSIS — W19XXXA Unspecified fall, initial encounter: Secondary | ICD-10-CM

## 2023-01-23 MED ORDER — BACITRACIN ZINC 500 UNIT/GM EX OINT
TOPICAL_OINTMENT | Freq: Once | CUTANEOUS | Status: AC
  Administered 2023-01-24: 2 via TOPICAL
  Filled 2023-01-23: qty 1.8

## 2023-01-23 MED ORDER — OXYCODONE-ACETAMINOPHEN 5-325 MG PO TABS
1.0000 | ORAL_TABLET | Freq: Once | ORAL | Status: AC
  Administered 2023-01-23: 1 via ORAL
  Filled 2023-01-23: qty 1

## 2023-01-23 MED ORDER — LIDOCAINE HCL (PF) 1 % IJ SOLN
5.0000 mL | Freq: Once | INTRAMUSCULAR | Status: AC
  Administered 2023-01-23: 5 mL via INTRADERMAL
  Filled 2023-01-23: qty 5

## 2023-01-23 NOTE — ED Notes (Signed)
Patient transported to CT. Ambulated with RT
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
## 2023-01-24 MED ORDER — OXYCODONE HCL 5 MG PO TABS: 5.0000 mg | ORAL_TABLET | Freq: Three times a day (TID) | ORAL | 0 refills | Status: AC | PRN

## 2023-01-24 NOTE — Discharge Instructions (Signed)
Please follow up with your primary care provider as needed.
